# Patient Record
Sex: Female | Born: 1985 | Race: White | Hispanic: No | Marital: Single | Smoking: Current every day smoker
Health system: Southern US, Community
[De-identification: ages and names within clinical notes are randomized; demographics above are authoritative.]

## PROBLEM LIST (undated history)

## (undated) ENCOUNTER — Inpatient Hospital Stay (HOSPITAL_COMMUNITY): Payer: Self-pay

## (undated) DIAGNOSIS — K5900 Constipation, unspecified: Secondary | ICD-10-CM

## (undated) DIAGNOSIS — N200 Calculus of kidney: Secondary | ICD-10-CM

## (undated) DIAGNOSIS — B977 Papillomavirus as the cause of diseases classified elsewhere: Secondary | ICD-10-CM

## (undated) DIAGNOSIS — IMO0002 Reserved for concepts with insufficient information to code with codable children: Secondary | ICD-10-CM

## (undated) DIAGNOSIS — R87619 Unspecified abnormal cytological findings in specimens from cervix uteri: Secondary | ICD-10-CM

## (undated) HISTORY — PX: DILATION AND CURETTAGE OF UTERUS: SHX78

---
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
## 2007-08-11 ENCOUNTER — Ambulatory Visit: Payer: Self-pay | Admitting: Internal Medicine

## 2007-08-11 DIAGNOSIS — L259 Unspecified contact dermatitis, unspecified cause: Secondary | ICD-10-CM

## 2007-08-11 DIAGNOSIS — F17200 Nicotine dependence, unspecified, uncomplicated: Secondary | ICD-10-CM | POA: Insufficient documentation

## 2008-01-26 ENCOUNTER — Ambulatory Visit: Payer: Self-pay | Admitting: Family Medicine

## 2008-01-26 DIAGNOSIS — K5289 Other specified noninfective gastroenteritis and colitis: Secondary | ICD-10-CM | POA: Insufficient documentation

## 2008-01-26 DIAGNOSIS — R112 Nausea with vomiting, unspecified: Secondary | ICD-10-CM

## 2008-01-26 LAB — CONVERTED CEMR LAB: Beta hcg, urine, semiquantitative: NEGATIVE

## 2010-08-28 ENCOUNTER — Emergency Department (HOSPITAL_COMMUNITY): Admission: EM | Admit: 2010-08-28 | Discharge: 2010-08-28 | Payer: Self-pay | Admitting: Emergency Medicine

## 2010-09-02 ENCOUNTER — Emergency Department (HOSPITAL_COMMUNITY): Admission: EM | Admit: 2010-09-02 | Discharge: 2010-09-02 | Payer: Self-pay | Admitting: Family Medicine

## 2010-09-20 ENCOUNTER — Emergency Department (HOSPITAL_COMMUNITY): Admission: EM | Admit: 2010-09-20 | Discharge: 2010-09-20 | Payer: Self-pay | Admitting: Family Medicine

## 2010-10-11 ENCOUNTER — Ambulatory Visit (HOSPITAL_COMMUNITY)
Admission: RE | Admit: 2010-10-11 | Discharge: 2010-10-11 | Payer: Self-pay | Source: Home / Self Care | Admitting: Urology

## 2011-02-21 LAB — POCT PREGNANCY, URINE
Preg Test, Ur: NEGATIVE
Preg Test, Ur: NEGATIVE

## 2011-02-21 LAB — POCT URINALYSIS DIPSTICK
Bilirubin Urine: NEGATIVE
Bilirubin Urine: NEGATIVE
Glucose, UA: NEGATIVE mg/dL
Ketones, ur: NEGATIVE mg/dL
Nitrite: NEGATIVE
Protein, ur: NEGATIVE mg/dL
Specific Gravity, Urine: <=1.005 (ref 1.005–1.030)
Urobilinogen, UA: 0.2 mg/dL (ref 0.0–1.0)
pH: 5.5 (ref 5.0–8.0)

## 2011-02-21 LAB — URINALYSIS, ROUTINE W REFLEX MICROSCOPIC
Ketones, ur: NEGATIVE mg/dL
Leukocytes, UA: NEGATIVE
Protein, ur: NEGATIVE mg/dL
Urobilinogen, UA: 0.2 mg/dL (ref 0.0–1.0)

## 2011-02-21 LAB — URINE MICROSCOPIC-ADD ON

## 2013-12-09 NOTE — L&D Delivery Note (Signed)
Attestation of Attending Supervision of Advanced Practitioner (CNM/NP): Evaluation and management procedures were performed by the Advanced Practitioner under my supervision and collaboration.  I have reviewed the Advanced Practitioner's note and chart, and I agree with the management and plan.  HARRAWAY-SMITH, Luan Urbani 10:27 AM     

## 2013-12-09 NOTE — L&D Delivery Note (Signed)
Delivery Note  At 2253, after about 1/5 hours of pushing,  a viable female was delivered via  (Presentation:LOA ).  The shoulders were not forthcoming, so the posterior (left) axilla was grasped with my index finger, and the baby was rotated clockwise into the oblique diameter.  At this point, the (now) anterior shoulder was released, and the baby delivered.  At no time was any traction placed on the baby's head.  APGAR: 9/9 ; weight pending.  40 units of pitocin diluted in 1000cc LR was infused rapidly IV.  The placenta separated spontaneously and delivered via CCT and maternal pushing effort.  It was inspected and appears to be intact with a 3 VC.  There were the following complications:   Anesthesia: Epidural  Episiotomy:  Lacerations:  Suture Repair: 3.0 vicryl Est. Blood Loss (mL): 200  Mom to postpartum.  Baby to Couplet care / Skin to Skin.  Delivery and repair by Dr. Andria MeuseStevens under my supervision

## 2013-12-16 ENCOUNTER — Inpatient Hospital Stay (HOSPITAL_COMMUNITY): Payer: Medicaid Other

## 2013-12-16 ENCOUNTER — Inpatient Hospital Stay (HOSPITAL_COMMUNITY)
Admission: AD | Admit: 2013-12-16 | Discharge: 2013-12-16 | Disposition: A | Discharge status: Home / Self Care | Payer: Medicaid Other | Source: Ambulatory Visit | Attending: Obstetrics & Gynecology | Admitting: Obstetrics & Gynecology

## 2013-12-16 ENCOUNTER — Encounter (HOSPITAL_COMMUNITY): Payer: Self-pay | Admitting: *Deleted

## 2013-12-16 DIAGNOSIS — O210 Mild hyperemesis gravidarum: Secondary | ICD-10-CM | POA: Insufficient documentation

## 2013-12-16 DIAGNOSIS — O99330 Smoking (tobacco) complicating pregnancy, unspecified trimester: Secondary | ICD-10-CM | POA: Insufficient documentation

## 2013-12-16 DIAGNOSIS — O98819 Other maternal infectious and parasitic diseases complicating pregnancy, unspecified trimester: Secondary | ICD-10-CM | POA: Insufficient documentation

## 2013-12-16 DIAGNOSIS — R109 Unspecified abdominal pain: Secondary | ICD-10-CM | POA: Insufficient documentation

## 2013-12-16 DIAGNOSIS — A5901 Trichomonal vulvovaginitis: Secondary | ICD-10-CM | POA: Insufficient documentation

## 2013-12-16 DIAGNOSIS — O219 Vomiting of pregnancy, unspecified: Secondary | ICD-10-CM

## 2013-12-16 DIAGNOSIS — N898 Other specified noninflammatory disorders of vagina: Secondary | ICD-10-CM | POA: Insufficient documentation

## 2013-12-16 DIAGNOSIS — O26899 Other specified pregnancy related conditions, unspecified trimester: Secondary | ICD-10-CM

## 2013-12-16 HISTORY — DX: Calculus of kidney: N20.0

## 2013-12-16 HISTORY — DX: Papillomavirus as the cause of diseases classified elsewhere: B97.7

## 2013-12-16 HISTORY — DX: Reserved for concepts with insufficient information to code with codable children: IMO0002

## 2013-12-16 HISTORY — DX: Unspecified abnormal cytological findings in specimens from cervix uteri: R87.619

## 2013-12-16 LAB — URINALYSIS, ROUTINE W REFLEX MICROSCOPIC
Bilirubin Urine: NEGATIVE
GLUCOSE, UA: NEGATIVE mg/dL
Hgb urine dipstick: NEGATIVE
KETONES UR: NEGATIVE mg/dL
LEUKOCYTES UA: NEGATIVE
Nitrite: NEGATIVE
PROTEIN: NEGATIVE mg/dL
Specific Gravity, Urine: 1.01 (ref 1.005–1.030)
Urobilinogen, UA: 0.2 mg/dL (ref 0.0–1.0)
pH: 6.5 (ref 5.0–8.0)

## 2013-12-16 LAB — CBC
HEMATOCRIT: 38 % (ref 36.0–46.0)
HEMOGLOBIN: 12.9 g/dL (ref 12.0–15.0)
MCH: 31.5 pg (ref 26.0–34.0)
MCHC: 33.9 g/dL (ref 30.0–36.0)
MCV: 92.9 fL (ref 78.0–100.0)
Platelets: 414 10*3/uL — ABNORMAL HIGH (ref 150–400)
RBC: 4.09 MIL/uL (ref 3.87–5.11)
RDW: 12.9 % (ref 11.5–15.5)
WBC: 12.7 10*3/uL — ABNORMAL HIGH (ref 4.0–10.5)

## 2013-12-16 LAB — HCG, QUANTITATIVE, PREGNANCY: hCG, Beta Chain, Quant, S: 50791 m[IU]/mL — ABNORMAL HIGH (ref <5)

## 2013-12-16 LAB — WET PREP, GENITAL
Clue Cells Wet Prep HPF POC: NONE SEEN
YEAST WET PREP: NONE SEEN

## 2013-12-16 LAB — POCT PREGNANCY, URINE: PREG TEST UR: POSITIVE — AB

## 2013-12-16 MED ORDER — METRONIDAZOLE 500 MG PO TABS
ORAL_TABLET | ORAL | Status: AC
  Filled 2013-12-16: qty 2

## 2013-12-16 MED ORDER — ONDANSETRON 8 MG PO TBDP: 8.0000 mg | ORAL_TABLET | Freq: Three times a day (TID) | ORAL | Status: DC | PRN

## 2013-12-16 MED ORDER — ONDANSETRON 8 MG PO TBDP
ORAL_TABLET | ORAL | Status: DC
Start: 2013-12-16 — End: 2013-12-16
  Filled 2013-12-16: qty 1

## 2013-12-16 MED ORDER — METRONIDAZOLE 500 MG PO TABS
1000.0000 mg | ORAL_TABLET | Freq: Once | ORAL | Status: AC
  Administered 2013-12-16: 500 mg via ORAL

## 2013-12-16 MED ORDER — ONDANSETRON 8 MG PO TBDP
8.0000 mg | ORAL_TABLET | Freq: Once | ORAL | Status: AC
  Administered 2013-12-16: 8 mg via ORAL

## 2013-12-16 MED ORDER — PROMETHAZINE HCL 25 MG PO TABS: 25.0000 mg | ORAL_TABLET | Freq: Four times a day (QID) | ORAL | Status: DC | PRN

## 2013-12-16 NOTE — MAU Note (Signed)
Pt reports she has had abd cramping on and off for the past 2-3 days.denies bleeding but reports a clear vaginal discharge.

## 2013-12-16 NOTE — MAU Provider Note (Signed)
History     CSN: [id number]  Arrival date and time: 12/16/13 1431   First Provider Initiated Contact with Patient 12/16/13 [phone]      Chief Complaint  Patient presents with  . Abdominal Cramping   HPI pt is G2P0010 @[redacted]w[redacted]d .  Pt presents with abdominal cramping on and off for the past 2 to 3 days.  The pain is worse during the day. Pt denies UTI sx, spotting or bleeding.  Pt has hx of kidney stones 2 years ago.  Pt denies vaginal bleeding; has small amount of Clear vaginal discharge.  Pt has been nauseated but has not been vomiting.   Pt had a positive home pregnancy test and positive pregnancy test at [facility].   RN note: Nurse Signed MAU Note Service date: 12/16/2013 3:13 PM   Pt reports she has had abd cramping on and off for the past 2-3 days.denies bleeding but reports a clear vaginal discharge.    Past Medical History  Diagnosis Date  . Kidney stones   . Abnormal Pap smear   . HPV (human papilloma virus) infection     Past Surgical History  Procedure Laterality Date  . Dilation and curettage of uterus      Family History  Problem Relation Age of Onset  . Diabetes Mother   . Heart disease Paternal Grandfather     History  Substance Use Topics  . Smoking status: Current Every Day Smoker -- 0.25 packs/day  . Smokeless tobacco: Not on file  . Alcohol Use: No    Allergies: No Known Allergies  Prescriptions prior to admission  Medication Sig Dispense Refill  . Prenatal Vit-Fe Fumarate-FA (PRENATAL MULTIVITAMIN) TABS tablet Take 1 tablet by mouth at bedtime.        Review of Systems  Constitutional: Negative for fever and chills.  Gastrointestinal: Positive for nausea, abdominal pain and constipation. Negative for heartburn, vomiting and diarrhea.  Genitourinary: Negative for dysuria, urgency and frequency.  Musculoskeletal: Positive for back pain.  Neurological: Negative for dizziness and headaches.   Physical Exam   Blood pressure  117/67, pulse 95, temperature 98.3 F (36.8 C), temperature source Oral, resp. rate 18, height 5\' 2"  (1.575 m), weight 73.755 kg (162 lb 9.6 oz), last menstrual period 10/10/2013.  Physical Exam  Nursing note and vitals reviewed. Constitutional: She is oriented to person, place, and time. She appears well-developed and well-nourished. No distress.  HENT:  Head: Normocephalic.  Eyes: Pupils are equal, round, and reactive to light.  Neck: Normal range of motion. Neck supple.  Cardiovascular: Normal rate.   Respiratory: Effort normal.  GI: Soft. She exhibits no distension. There is tenderness. There is no rebound and no guarding.  Genitourinary: Vagina normal and uterus normal. No vaginal discharge found.  Musculoskeletal: Normal range of motion.  Neurological: She is alert and oriented to person, place, and time.  Skin: Skin is warm and dry.  Psychiatric: She has a normal mood and affect.    MAU Course  Procedures Results for orders placed during the hospital encounter of 12/16/13 (from the past 24 hour(s))  URINALYSIS, ROUTINE W REFLEX MICROSCOPIC     Status: None   Collection Time    12/16/13  3:15 PM      Result Value Range   Color, Urine YELLOW  YELLOW   APPearance CLEAR  CLEAR   Specific Gravity, Urine 1.010  1.005 - 1.030   pH 6.5  5.0 - 8.0   Glucose, UA NEGATIVE  NEGATIVE  mg/dL   Hgb urine dipstick NEGATIVE  NEGATIVE   Bilirubin Urine NEGATIVE  NEGATIVE   Ketones, ur NEGATIVE  NEGATIVE mg/dL   Protein, ur NEGATIVE  NEGATIVE mg/dL   Urobilinogen, UA 0.2  0.0 - 1.0 mg/dL   Nitrite NEGATIVE  NEGATIVE   Leukocytes, UA NEGATIVE  NEGATIVE  POCT PREGNANCY, URINE     Status: Abnormal   Collection Time    12/16/13  4:16 PM      Result Value Range   Preg Test, Ur POSITIVE (*) NEGATIVE  CBC     Status: Abnormal   Collection Time    12/16/13  4:18 PM      Result Value Range   WBC 12.7 (*) 4.0 - 10.5 K/uL   RBC 4.09  3.87 - 5.11 MIL/uL   Hemoglobin 12.9  12.0 - 15.0 g/dL    HCT 69.638.0  29.536.0 - 28.446.0 %   MCV 92.9  78.0 - 100.0 fL   MCH 31.5  26.0 - 34.0 pg   MCHC 33.9  30.0 - 36.0 g/dL   RDW 13.212.9  44.011.5 - 10.215.5 %   Platelets 414 (*) 150 - 400 K/uL  [place] Ob Comp Less 14 Wks  12/16/2013   CLINICAL DATA:  Pregnant, cramping  EXAM: OBSTETRIC <14 WK ULTRASOUND  TECHNIQUE: Transabdominal ultrasound was performed for evaluation of the gestation as well as the maternal uterus and adnexal regions.  COMPARISON:  None.  FINDINGS: Intrauterine gestational sac: Visualized/normal in shape.  Yolk sac:  Identified  Embryo:  Identified  Cardiac Activity: Identified  Heart Rate: 179 bpm  CRL:   23.7  mm   9 w 1 d                  US EDC: 07/20/2014  Maternal uterus/adnexae: No evidence of subchorionic hemorrhage. Left ovary not visualized. There is a simple cyst on the right ovary measuring last 3 cm. This could represent corpus luteum.  IMPRESSION: Live intrauterine gestation   Electronically Signed   By: Esperanza Heiraymond  Rubner M.D.   On: 12/16/2013 17:09   Results for orders placed during the hospital encounter of 12/16/13 (from the past 24 hour(s))  URINALYSIS, ROUTINE W REFLEX MICROSCOPIC     Status: None   Collection Time    12/16/13  3:15 PM      Result Value Range   Color, Urine YELLOW  YELLOW   APPearance CLEAR  CLEAR   Specific Gravity, Urine 1.010  1.005 - 1.030   pH 6.5  5.0 - 8.0   Glucose, UA NEGATIVE  NEGATIVE mg/dL   Hgb urine dipstick NEGATIVE  NEGATIVE   Bilirubin Urine NEGATIVE  NEGATIVE   Ketones, ur NEGATIVE  NEGATIVE mg/dL   Protein, ur NEGATIVE  NEGATIVE mg/dL   Urobilinogen, UA 0.2  0.0 - 1.0 mg/dL   Nitrite NEGATIVE  NEGATIVE   Leukocytes, UA NEGATIVE  NEGATIVE  POCT PREGNANCY, URINE     Status: Abnormal   Collection Time    12/16/13  4:16 PM      Result Value Range   Preg Test, Ur POSITIVE (*) NEGATIVE  HCG, QUANTITATIVE, PREGNANCY     Status: Abnormal   Collection Time    12/16/13  4:18 PM      Result Value Range   hCG, Beta Chain, [place] LongestQuant, S [id number] (*) <5 mIU/mL   CBC     Status: Abnormal   Collection Time    12/16/13  4:18 PM  Result Value Range   WBC 12.7 (*) 4.0 - 10.5 K/uL   RBC 4.09  3.87 - 5.11 MIL/uL   Hemoglobin 12.9  12.0 - 15.0 g/dL   HCT 16.1  09.6 - 04.5 %   MCV 92.9  78.0 - 100.0 fL   MCH 31.5  26.0 - 34.0 pg   MCHC 33.9  30.0 - 36.0 g/dL   RDW 40.9  81.1 - 91.4 %   Platelets 414 (*) 150 - 400 K/uL  WET PREP, GENITAL     Status: Abnormal   Collection Time    12/16/13  4:25 PM      Result Value Range   Yeast Wet Prep HPF POC NONE SEEN  NONE SEEN   Trich, Wet Prep FEW (*) NONE SEEN   Clue Cells Wet Prep HPF POC NONE SEEN  NONE SEEN   WBC, Wet Prep HPF POC FEW (*) NONE SEEN  Flagyl 1 gm give in MAU- partner to go to health dept to be treated - no sex until 1 week after both treated US Ob Comp Less 14 Wks  12/16/2013   CLINICAL DATA:  Pregnant, cramping  EXAM: OBSTETRIC <14 WK ULTRASOUND  TECHNIQUE: Transabdominal ultrasound was performed for evaluation of the gestation as well as the maternal uterus and adnexal regions.  COMPARISON:  None.  FINDINGS: Intrauterine gestational sac: Visualized/normal in shape.  Yolk sac:  Identified  Embryo:  Identified  Cardiac Activity: Identified  Heart Rate: 179 bpm  CRL:   23.7  mm   9 w 1 d                  [place] EDC: 07/20/2014  Maternal uterus/adnexae: No evidence of subchorionic hemorrhage. Left ovary not visualized. There is a simple cyst on the right ovary measuring last 3 cm. This could represent corpus luteum.  IMPRESSION: Live intrauterine gestation   Electronically Signed   By: Esperanza Heir M.D.   On: 12/16/2013 17:09   GC/Chlamydia pending Assessment and Plan  Abdominal pain in pregnancy Viable [redacted]w[redacted]d IUP Morning sickness- Zofran and phenergan Rx diet for morning sickness Trichomonas vaginitis- Rx in MAU F/u for OB care- note sent to clinic for pt to be seen at [facility]  [facility] 12/16/2013, 4:03 PM

## 2013-12-16 NOTE — MAU Provider Note (Signed)
Attestation of Attending Supervision of Advanced Practitioner (CNM/NP): Evaluation and management procedures were performed by the Advanced Practitioner under my supervision and collaboration.  I have reviewed the Advanced Practitioner's note and chart, and I agree with the management and plan.  HARRAWAY-SMITH, Mayford Alberg 10:44 PM     

## 2013-12-17 LAB — GC/CHLAMYDIA PROBE AMP
CT PROBE, AMP APTIMA: NEGATIVE
GC PROBE AMP APTIMA: NEGATIVE

## 2014-01-25 ENCOUNTER — Encounter: Payer: Self-pay | Admitting: Family

## 2014-01-25 ENCOUNTER — Ambulatory Visit (INDEPENDENT_AMBULATORY_CARE_PROVIDER_SITE_OTHER): Payer: Medicaid Other | Admitting: Family

## 2014-01-25 ENCOUNTER — Other Ambulatory Visit (HOSPITAL_COMMUNITY)
Admission: RE | Admit: 2014-01-25 | Discharge: 2014-01-25 | Disposition: A | Discharge status: Home / Self Care | Payer: Medicaid Other | Source: Ambulatory Visit | Attending: Family Medicine | Admitting: Family Medicine

## 2014-01-25 VITALS — BP 113/77 | Temp 97.5°F | Wt 165.4 lb

## 2014-01-25 DIAGNOSIS — Z3480 Encounter for supervision of other normal pregnancy, unspecified trimester: Secondary | ICD-10-CM

## 2014-01-25 DIAGNOSIS — Z01419 Encounter for gynecological examination (general) (routine) without abnormal findings: Secondary | ICD-10-CM | POA: Insufficient documentation

## 2014-01-25 DIAGNOSIS — Z3490 Encounter for supervision of normal pregnancy, unspecified, unspecified trimester: Secondary | ICD-10-CM

## 2014-01-25 DIAGNOSIS — Z23 Encounter for immunization: Secondary | ICD-10-CM

## 2014-01-25 DIAGNOSIS — O0990 Supervision of high risk pregnancy, unspecified, unspecified trimester: Secondary | ICD-10-CM | POA: Insufficient documentation

## 2014-01-25 DIAGNOSIS — A5901 Trichomonal vulvovaginitis: Secondary | ICD-10-CM | POA: Insufficient documentation

## 2014-01-25 DIAGNOSIS — O2390 Unspecified genitourinary tract infection in pregnancy, unspecified trimester: Secondary | ICD-10-CM

## 2014-01-25 DIAGNOSIS — Z124 Encounter for screening for malignant neoplasm of cervix: Secondary | ICD-10-CM

## 2014-01-25 DIAGNOSIS — O0930 Supervision of pregnancy with insufficient antenatal care, unspecified trimester: Secondary | ICD-10-CM

## 2014-01-25 DIAGNOSIS — O23591 Infection of other part of genital tract in pregnancy, first trimester: Secondary | ICD-10-CM

## 2014-01-25 LAB — POCT URINALYSIS DIP (DEVICE)
BILIRUBIN URINE: NEGATIVE
GLUCOSE, UA: NEGATIVE mg/dL
Hgb urine dipstick: NEGATIVE
Ketones, ur: NEGATIVE mg/dL
LEUKOCYTES UA: NEGATIVE
NITRITE: NEGATIVE
PH: 7 (ref 5.0–8.0)
Protein, ur: NEGATIVE mg/dL
Specific Gravity, Urine: 1.015 (ref 1.005–1.030)
Urobilinogen, UA: 0.2 mg/dL (ref 0.0–1.0)

## 2014-01-25 LAB — HIV ANTIBODY (ROUTINE TESTING W REFLEX): HIV: NONREACTIVE

## 2014-01-25 NOTE — Progress Notes (Signed)
   Subjective:    Taylor Ford is a G2P0010 1019w6d being seen today for her first obstetrical visit.  Her obstetrical history is significant for trichomoniasis in 1st trimester.. Patient does intend to breast feed. Pregnancy history fully reviewed.  Patient reports no bleeding, no contractions and no cramping.  Filed Vitals:   01/25/14 1312  BP: 113/77  Temp: 97.5 F (36.4 C)  Weight: 165 lb 6.4 oz (75.025 kg)    HISTORY: OB History  Gravida Para Term Preterm AB SAB TAB Ectopic Multiple Living  2 0 0 0 1 1 0 0 0 0     # Outcome Date GA Lbr Len/2nd Weight Sex Delivery Anes PTL Lv  2 CUR           1 SAB 2007             Past Medical History  Diagnosis Date  . Abnormal Pap smear     abnl 28 yo, all paps normal since  . HPV (human papilloma virus) infection   . Kidney stones     2012   Past Surgical History  Procedure Laterality Date  . Dilation and curettage of uterus     Family History  Problem Relation Age of Onset  . Diabetes Mother   . Heart disease Paternal Grandfather   . Cancer Maternal Grandfather   . Hypertension Paternal Grandmother      Exam     Exam   BP 113/77  Temp(Src) 97.5 F (36.4 C)  Wt 165 lb 6.4 oz (75.025 kg)  LMP 10/10/2013 Uterine Size: Fundal height 14cm  Pelvic Exam:    Perineum: No Hemorrhoids, Normal Perineum   Vulva: normal   Vagina:  normal mucosa, normal discharge, no palpable nodules   pH: Not done   Cervix: no bleeding following Pap, no cervical motion tenderness and no lesions   Adnexa: normal adnexa and no mass, fullness, tenderness   Bony Pelvis: Adequate  System: Breast:  No nipple retraction or dimpling, No nipple discharge or bleeding, No axillary or supraclavicular adenopathy, Normal to palpation without dominant masses   Skin: normal coloration and turgor, no rashes    Neurologic: negative   Extremities: normal strength, tone, and muscle mass   HEENT neck supple with midline trachea and thyroid without  masses   Mouth/Teeth mucous membranes moist, pharynx normal without lesions   Neck supple and no masses   Cardiovascular: regular rate and rhythm, no murmurs or gallops   Respiratory:  appears well, vitals normal, no respiratory distress, acyanotic, normal RR, neck free of mass or lymphadenopathy, chest clear, no wheezing, crepitations, rhonchi, normal symmetric air entry   Abdomen: soft, non-tender; bowel sounds normal; no masses,  no organomegaly   Urinary: urethral meatus normal       Assessment:    Pregnancy: 28 yo G2P0010 at 14 wks IUP Trichomoniasis in 1st Trimester  Patient Active Problem List   Diagnosis Date Noted  . Supervision of normal pregnancy 01/25/2014  . Trichomonal vaginitis in pregnancy in first trimester 01/25/2014  . GASTROENTERITIS, ACUTE 01/26/2008  . NAUSEA WITH VOMITING 01/26/2008  . TOBACCO ABUSE 08/11/2007  . DERMATITIS, CONTACT, NOS 08/11/2007        Plan:     Initial labs drawn. Pap collected. Prenatal vitamins. Problem list reviewed and updated. Genetic Screening discussed Quad Screen: requested. Order at next visit. Ultrasound discussed; fetal survey: ordered. Follow up in 4 weeks.  [facility]UHAMMAD,WALIDAH 01/25/2014

## 2014-01-25 NOTE — Patient Instructions (Signed)
Second Trimester of Pregnancy The second trimester is from week 13 through week 28, months 4 through 6. The second trimester is often a time when you feel your best. Your body has also adjusted to being pregnant, and you begin to feel better physically. Usually, morning sickness has lessened or quit completely, you may have more energy, and you may have an increase in appetite. The second trimester is also a time when the fetus is growing rapidly. At the end of the sixth month, the fetus is about 9 inches long and weighs about 1 pounds. You will likely begin to feel the baby move (quickening) between 18 and 20 weeks of the pregnancy. BODY CHANGES Your body goes through many changes during pregnancy. The changes vary from woman to woman.   Your weight will continue to increase. You will notice your lower abdomen bulging out.  You may begin to get stretch marks on your hips, abdomen, and breasts.  You may develop headaches that can be relieved by medicines approved by your caregiver.  You may urinate more often because the fetus is pressing on your bladder.  You may develop or continue to have heartburn as a result of your pregnancy.  You may develop constipation because certain hormones are causing the muscles that push waste through your intestines to slow down.  You may develop hemorrhoids or swollen, bulging veins (varicose veins).  You may have back pain because of the weight gain and pregnancy hormones relaxing your joints between the bones in your pelvis and as a result of a shift in weight and the muscles that support your balance.  Your breasts will continue to grow and be tender.  Your gums may bleed and may be sensitive to brushing and flossing.  Dark spots or blotches (chloasma, mask of pregnancy) may develop on your face. This will likely fade after the baby is born.  A dark line from your belly button to the pubic area (linea nigra) may appear. This will likely fade after the  baby is born. WHAT TO EXPECT AT YOUR PRENATAL VISITS During a routine prenatal visit:  You will be weighed to make sure you and the fetus are growing normally.  Your blood pressure will be taken.  Your abdomen will be measured to track your baby's growth.  The fetal heartbeat will be listened to.  Any test results from the previous visit will be discussed. Your caregiver may ask you:  How you are feeling.  If you are feeling the baby move.  If you have had any abnormal symptoms, such as leaking fluid, bleeding, severe headaches, or abdominal cramping.  If you have any questions. Other tests that may be performed during your second trimester include:  Blood tests that check for:  Low iron levels (anemia).  Gestational diabetes (between 24 and 28 weeks).  Rh antibodies.  Urine tests to check for infections, diabetes, or protein in the urine.  An ultrasound to confirm the proper growth and development of the baby.  An amniocentesis to check for possible genetic problems.  Fetal screens for spina bifida and Down syndrome. HOME CARE INSTRUCTIONS   Avoid all smoking, herbs, alcohol, and unprescribed drugs. These chemicals affect the formation and growth of the baby.  Follow your caregiver's instructions regarding medicine use. There are medicines that are either safe or unsafe to take during pregnancy.  Exercise only as directed by your caregiver. Experiencing uterine cramps is a good sign to stop exercising.  Continue to eat regular,   healthy meals.  Wear a good support bra for breast tenderness.  Do not use hot tubs, steam rooms, or saunas.  Wear your seat belt at all times when driving.  Avoid raw meat, uncooked cheese, cat litter boxes, and soil used by cats. These carry germs that can cause birth defects in the baby.  Take your prenatal vitamins.  Try taking a stool softener (if your caregiver approves) if you develop constipation. Eat more high-fiber foods,  such as fresh vegetables or fruit and whole grains. Drink plenty of fluids to keep your urine clear or pale yellow.  Take warm sitz baths to soothe any pain or discomfort caused by hemorrhoids. Use hemorrhoid cream if your caregiver approves.  If you develop varicose veins, wear support hose. Elevate your feet for 15 minutes, 3 4 times a day. Limit salt in your diet.  Avoid heavy lifting, wear low heel shoes, and practice good posture.  Rest with your legs elevated if you have leg cramps or low back pain.  Visit your dentist if you have not gone yet during your pregnancy. Use a soft toothbrush to brush your teeth and be gentle when you floss.  A sexual relationship may be continued unless your caregiver directs you otherwise.  Continue to go to all your prenatal visits as directed by your caregiver. SEEK MEDICAL CARE IF:   You have dizziness.  You have mild pelvic cramps, pelvic pressure, or nagging pain in the abdominal area.  You have persistent nausea, vomiting, or diarrhea.  You have a bad smelling vaginal discharge.  You have pain with urination. SEEK IMMEDIATE MEDICAL CARE IF:   You have a fever.  You are leaking fluid from your vagina.  You have spotting or bleeding from your vagina.  You have severe abdominal cramping or pain.  You have rapid weight gain or loss.  You have shortness of breath with chest pain.  You notice sudden or extreme swelling of your face, hands, ankles, feet, or legs.  You have not felt your baby move in over an hour.  You have severe headaches that do not go away with medicine.  You have vision changes. Document Released: 11/19/2001 Document Revised: 07/28/2013 Document Reviewed: 01/26/2013 [employer] Patient Information 2014 [employer].  

## 2014-01-25 NOTE — Assessment & Plan Note (Signed)
Flu vaccine 01/25/2014

## 2014-01-25 NOTE — Progress Notes (Signed)
Pulse- 102 Patient reports pain/numbness in right hip

## 2014-01-26 ENCOUNTER — Encounter: Payer: Self-pay | Admitting: Family

## 2014-01-26 LAB — PRESCRIPTION MONITORING PROFILE (19 PANEL)
Amphetamine/Meth: NEGATIVE ng/mL
Barbiturate Screen, Urine: NEGATIVE ng/mL
Benzodiazepine Screen, Urine: NEGATIVE ng/mL
Buprenorphine, Urine: NEGATIVE ng/mL
CARISOPRODOL, URINE: NEGATIVE ng/mL
Cannabinoid Scrn, Ur: NEGATIVE ng/mL
Cocaine Metabolites: NEGATIVE ng/mL
Creatinine, Urine: 35.66 mg/dL (ref >20.0)
Fentanyl, Ur: NEGATIVE ng/mL
MDMA URINE: NEGATIVE ng/mL
MEPERIDINE UR: NEGATIVE ng/mL
Methadone Screen, Urine: NEGATIVE ng/mL
Methaqualone: NEGATIVE ng/mL
NITRITES URINE, INITIAL: NEGATIVE ug/mL
OXYCODONE SCRN UR: NEGATIVE ng/mL
Opiate Screen, Urine: NEGATIVE ng/mL
PH URINE, INITIAL: 7.9 pH (ref 4.5–8.9)
Phencyclidine, Ur: NEGATIVE ng/mL
Propoxyphene: NEGATIVE ng/mL
TRAMADOL UR: NEGATIVE ng/mL
Tapentadol, urine: NEGATIVE ng/mL
Zolpidem, Urine: NEGATIVE ng/mL

## 2014-01-26 LAB — OBSTETRIC PANEL
Antibody Screen: NEGATIVE
Basophils Absolute: 0 10*3/uL (ref 0.0–0.1)
Basophils Relative: 0 % (ref 0–1)
Eosinophils Absolute: 0.1 10*3/uL (ref 0.0–0.7)
Eosinophils Relative: 1 % (ref 0–5)
HCT: 35.9 % — ABNORMAL LOW (ref 36.0–46.0)
HEP B S AG: NEGATIVE
Hemoglobin: 12.1 g/dL (ref 12.0–15.0)
LYMPHS ABS: 1.8 10*3/uL (ref 0.7–4.0)
LYMPHS PCT: 17 % (ref 12–46)
MCH: 31.5 pg (ref 26.0–34.0)
MCHC: 33.7 g/dL (ref 30.0–36.0)
MCV: 93.5 fL (ref 78.0–100.0)
Monocytes Absolute: 0.4 10*3/uL (ref 0.1–1.0)
Monocytes Relative: 4 % (ref 3–12)
NEUTROS ABS: 8.5 10*3/uL — AB (ref 1.7–7.7)
NEUTROS PCT: 78 % — AB (ref 43–77)
PLATELETS: 416 10*3/uL — AB (ref 150–400)
RBC: 3.84 MIL/uL — AB (ref 3.87–5.11)
RDW: 13.3 % (ref 11.5–15.5)
RUBELLA: 0.7 {index} (ref <0.90)
Rh Type: POSITIVE
WBC: 10.7 10*3/uL — AB (ref 4.0–10.5)

## 2014-01-26 LAB — GLUCOSE TOLERANCE, 1 HOUR (50G) W/O FASTING: GLUCOSE 1 HOUR GTT: 133 mg/dL (ref 70–140)

## 2014-01-27 ENCOUNTER — Encounter: Payer: Self-pay | Admitting: Family

## 2014-01-27 LAB — CULTURE, OB URINE
Colony Count: NO GROWTH
Organism ID, Bacteria: NO GROWTH

## 2014-01-27 LAB — CYSTIC FIBROSIS DIAGNOSTIC STUDY

## 2014-02-23 ENCOUNTER — Ambulatory Visit (INDEPENDENT_AMBULATORY_CARE_PROVIDER_SITE_OTHER): Payer: Medicaid Other | Admitting: Family Medicine

## 2014-02-23 ENCOUNTER — Encounter: Payer: Self-pay | Admitting: Family Medicine

## 2014-02-23 VITALS — BP 116/78 | Wt 169.9 lb

## 2014-02-23 DIAGNOSIS — Z3490 Encounter for supervision of normal pregnancy, unspecified, unspecified trimester: Secondary | ICD-10-CM

## 2014-02-23 DIAGNOSIS — Z3480 Encounter for supervision of other normal pregnancy, unspecified trimester: Secondary | ICD-10-CM

## 2014-02-23 LAB — POCT URINALYSIS DIP (DEVICE)
Bilirubin Urine: NEGATIVE
Glucose, UA: NEGATIVE mg/dL
HGB URINE DIPSTICK: NEGATIVE
Ketones, ur: NEGATIVE mg/dL
Leukocytes, UA: NEGATIVE
NITRITE: NEGATIVE
Protein, ur: NEGATIVE mg/dL
Specific Gravity, Urine: 1.02 (ref 1.005–1.030)
UROBILINOGEN UA: 0.2 mg/dL (ref 0.0–1.0)
pH: 7 (ref 5.0–8.0)

## 2014-02-23 NOTE — Progress Notes (Signed)
Pulse- 101 

## 2014-02-23 NOTE — Progress Notes (Signed)
S: 28 yo G2P0010 @ 2990w0d here for ROBV - still smoking. Wanting to kow what to take to quit - quad screen today - no ctx, lof, vb  O: see flowsheet  A/P - doing well - discussed quitting smoking and urged both her and husband to quit. Ok to use patches if goal is to come off completely but not if goal is to supplement for cigarrettes - quad today - US tomorrow - f/u in 4 weeks.

## 2014-02-23 NOTE — Patient Instructions (Signed)
Second Trimester of Pregnancy The second trimester is from week 13 through week 28, months 4 through 6. The second trimester is often a time when you feel your best. Your body has also adjusted to being pregnant, and you begin to feel better physically. Usually, morning sickness has lessened or quit completely, you may have more energy, and you may have an increase in appetite. The second trimester is also a time when the fetus is growing rapidly. At the end of the sixth month, the fetus is about 9 inches long and weighs about 1 pounds. You will likely begin to feel the baby move (quickening) between 18 and 20 weeks of the pregnancy. BODY CHANGES Your body goes through many changes during pregnancy. The changes vary from woman to woman.   Your weight will continue to increase. You will notice your lower abdomen bulging out.  You may begin to get stretch marks on your hips, abdomen, and breasts.  You may develop headaches that can be relieved by medicines approved by your caregiver.  You may urinate more often because the fetus is pressing on your bladder.  You may develop or continue to have heartburn as a result of your pregnancy.  You may develop constipation because certain hormones are causing the muscles that push waste through your intestines to slow down.  You may develop hemorrhoids or swollen, bulging veins (varicose veins).  You may have back pain because of the weight gain and pregnancy hormones relaxing your joints between the bones in your pelvis and as a result of a shift in weight and the muscles that support your balance.  Your breasts will continue to grow and be tender.  Your gums may bleed and may be sensitive to brushing and flossing.  Dark spots or blotches (chloasma, mask of pregnancy) may develop on your face. This will likely fade after the baby is born.  A dark line from your belly button to the pubic area (linea nigra) may appear. This will likely fade after the  baby is born. WHAT TO EXPECT AT YOUR PRENATAL VISITS During a routine prenatal visit:  You will be weighed to make sure you and the fetus are growing normally.  Your blood pressure will be taken.  Your abdomen will be measured to track your baby's growth.  The fetal heartbeat will be listened to.  Any test results from the previous visit will be discussed. Your caregiver may ask you:  How you are feeling.  If you are feeling the baby move.  If you have had any abnormal symptoms, such as leaking fluid, bleeding, severe headaches, or abdominal cramping.  If you have any questions. Other tests that may be performed during your second trimester include:  Blood tests that check for:  Low iron levels (anemia).  Gestational diabetes (between 24 and 28 weeks).  Rh antibodies.  Urine tests to check for infections, diabetes, or protein in the urine.  An ultrasound to confirm the proper growth and development of the baby.  An amniocentesis to check for possible genetic problems.  Fetal screens for spina bifida and Down syndrome. HOME CARE INSTRUCTIONS   Avoid all smoking, herbs, alcohol, and unprescribed drugs. These chemicals affect the formation and growth of the baby.  Follow your caregiver's instructions regarding medicine use. There are medicines that are either safe or unsafe to take during pregnancy.  Exercise only as directed by your caregiver. Experiencing uterine cramps is a good sign to stop exercising.  Continue to eat regular,   healthy meals.  Wear a good support bra for breast tenderness.  Do not use hot tubs, steam rooms, or saunas.  Wear your seat belt at all times when driving.  Avoid raw meat, uncooked cheese, cat litter boxes, and soil used by cats. These carry germs that can cause birth defects in the baby.  Take your prenatal vitamins.  Try taking a stool softener (if your caregiver approves) if you develop constipation. Eat more high-fiber foods,  such as fresh vegetables or fruit and whole grains. Drink plenty of fluids to keep your urine clear or pale yellow.  Take warm sitz baths to soothe any pain or discomfort caused by hemorrhoids. Use hemorrhoid cream if your caregiver approves.  If you develop varicose veins, wear support hose. Elevate your feet for 15 minutes, 3 4 times a day. Limit salt in your diet.  Avoid heavy lifting, wear low heel shoes, and practice good posture.  Rest with your legs elevated if you have leg cramps or low back pain.  Visit your dentist if you have not gone yet during your pregnancy. Use a soft toothbrush to brush your teeth and be gentle when you floss.  A sexual relationship may be continued unless your caregiver directs you otherwise.  Continue to go to all your prenatal visits as directed by your caregiver. SEEK MEDICAL CARE IF:   You have dizziness.  You have mild pelvic cramps, pelvic pressure, or nagging pain in the abdominal area.  You have persistent nausea, vomiting, or diarrhea.  You have a bad smelling vaginal discharge.  You have pain with urination. SEEK IMMEDIATE MEDICAL CARE IF:   You have a fever.  You are leaking fluid from your vagina.  You have spotting or bleeding from your vagina.  You have severe abdominal cramping or pain.  You have rapid weight gain or loss.  You have shortness of breath with chest pain.  You notice sudden or extreme swelling of your face, hands, ankles, feet, or legs.  You have not felt your baby move in over an hour.  You have severe headaches that do not go away with medicine.  You have vision changes. Document Released: 11/19/2001 Document Revised: 07/28/2013 Document Reviewed: 01/26/2013 [employer] Patient Information 2014 [employer].  

## 2014-02-24 ENCOUNTER — Ambulatory Visit (HOSPITAL_COMMUNITY)
Admission: RE | Admit: 2014-02-24 | Discharge: 2014-02-24 | Disposition: A | Discharge status: Home / Self Care | Payer: Medicaid Other | Source: Ambulatory Visit | Attending: Family | Admitting: Family

## 2014-02-24 ENCOUNTER — Encounter: Payer: Self-pay | Admitting: Family

## 2014-02-24 DIAGNOSIS — Z3490 Encounter for supervision of normal pregnancy, unspecified, unspecified trimester: Secondary | ICD-10-CM

## 2014-02-24 DIAGNOSIS — Z3689 Encounter for other specified antenatal screening: Secondary | ICD-10-CM | POA: Insufficient documentation

## 2014-02-24 LAB — AFP, QUAD SCREEN
AFP: 69.4 [IU]/mL
CURR GEST AGE: 19 wks.days
Down Syndrome Scr Risk Est: <1:38500 {titer}
HCG TOTAL: 15884 m[IU]/mL
INH: 471.1 pg/mL
Interpretation-AFP: NEGATIVE
MOM FOR AFP: 1.62
MoM for INH: 2.73
MoM for hCG: 1
Open Spina bifida: NEGATIVE
Osb Risk: 1:2670 {titer}
Tri 18 Scr Risk Est: NEGATIVE
Trisomy 18 (Edward) Syndrome Interp.: 1:123000 {titer}
uE3 Mom: 1.43
uE3 Value: 1.4 ng/mL

## 2014-02-25 ENCOUNTER — Telehealth: Payer: Self-pay | Admitting: *Deleted

## 2014-02-25 NOTE — Telephone Encounter (Signed)
Message copied by Dorothyann PengHAIZLIP, Quincy Boy E on Fri Feb 25, 2014 11:55 AM ------      Message from: Vale HavenBECK, KELI L      Created: Thu Feb 24, 2014  5:02 PM       Can you let her know her quad was neg? ------

## 2014-02-25 NOTE — Telephone Encounter (Signed)
Spoke with patient concerning results, pt verbalized understanding.

## 2014-03-23 ENCOUNTER — Encounter: Payer: Self-pay | Admitting: Obstetrics and Gynecology

## 2014-03-23 ENCOUNTER — Ambulatory Visit (INDEPENDENT_AMBULATORY_CARE_PROVIDER_SITE_OTHER): Payer: Medicaid Other | Admitting: Obstetrics and Gynecology

## 2014-03-23 VITALS — BP 117/69 | Wt 178.2 lb

## 2014-03-23 DIAGNOSIS — Z3480 Encounter for supervision of other normal pregnancy, unspecified trimester: Secondary | ICD-10-CM

## 2014-03-23 DIAGNOSIS — Z3490 Encounter for supervision of normal pregnancy, unspecified, unspecified trimester: Secondary | ICD-10-CM

## 2014-03-23 LAB — POCT URINALYSIS DIP (DEVICE)
BILIRUBIN URINE: NEGATIVE
Glucose, UA: NEGATIVE mg/dL
Hgb urine dipstick: NEGATIVE
Ketones, ur: NEGATIVE mg/dL
LEUKOCYTES UA: NEGATIVE
NITRITE: NEGATIVE
PH: 5.5 (ref 5.0–8.0)
Protein, ur: NEGATIVE mg/dL
Specific Gravity, Urine: <=1.005 (ref 1.005–1.030)
Urobilinogen, UA: 0.2 mg/dL (ref 0.0–1.0)

## 2014-03-23 NOTE — Progress Notes (Signed)
Pulse: 100 Needs followup us scheduled.

## 2014-03-23 NOTE — Patient Instructions (Signed)
Smoking Cessation Quitting smoking is important to your health and has many advantages. However, it is not always easy to quit since nicotine is a very addictive drug. Often times, people try 3 times or more before being able to quit. This document explains the best ways for you to prepare to quit smoking. Quitting takes hard work and a lot of effort, but you can do it. ADVANTAGES OF QUITTING SMOKING  You will live longer, feel better, and live better.  Your body will feel the impact of quitting smoking almost immediately.  Within 20 minutes, blood pressure decreases. Your pulse returns to its normal level.  After 8 hours, carbon monoxide levels in the blood return to normal. Your oxygen level increases.  After 24 hours, the chance of having a heart attack starts to decrease. Your breath, hair, and body stop smelling like smoke.  After 48 hours, damaged nerve endings begin to recover. Your sense of taste and smell improve.  After 72 hours, the body is virtually free of nicotine. Your bronchial tubes relax and breathing becomes easier.  After 2 to 12 weeks, lungs can hold more air. Exercise becomes easier and circulation improves.  The risk of having a heart attack, stroke, cancer, or lung disease is greatly reduced.  After 1 year, the risk of coronary heart disease is cut in half.  After 5 years, the risk of stroke falls to the same as a nonsmoker.  After 10 years, the risk of lung cancer is cut in half and the risk of other cancers decreases significantly.  After 15 years, the risk of coronary heart disease drops, usually to the level of a nonsmoker.  If you are pregnant, quitting smoking will improve your chances of having a healthy baby.  The people you live with, especially any children, will be healthier.  You will have extra money to spend on things other than cigarettes. QUESTIONS TO THINK ABOUT BEFORE ATTEMPTING TO QUIT You may want to talk about your answers with your  caregiver.  Why do you want to quit?  If you tried to quit in the past, what helped and what did not?  What will be the most difficult situations for you after you quit? How will you plan to handle them?  Who can help you through the tough times? Your family? Friends? A caregiver?  What pleasures do you get from smoking? What ways can you still get pleasure if you quit? Here are some questions to ask your caregiver:  How can you help me to be successful at quitting?  What medicine do you think would be best for me and how should I take it?  What should I do if I need more help?  What is smoking withdrawal like? How can I get information on withdrawal? GET READY  Set a quit date.  Change your environment by getting rid of all cigarettes, ashtrays, matches, and lighters in your home, car, or work. Do not let people smoke in your home.  Review your past attempts to quit. Think about what worked and what did not. GET SUPPORT AND ENCOURAGEMENT You have a better chance of being successful if you have help. You can get support in many ways.  Tell your family, friends, and co-workers that you are going to quit and need their support. Ask them not to smoke around you.  Get individual, group, or telephone counseling and support. Programs are available at local hospitals and health centers. Call your local health department for   information about programs in your area.  Spiritual beliefs and practices may help some smokers quit.  Download a "quit meter" on your computer to keep track of quit statistics, such as how long you have gone without smoking, cigarettes not smoked, and money saved.  Get a self-help book about quitting smoking and staying off of tobacco. LEARN NEW SKILLS AND BEHAVIORS  Distract yourself from urges to smoke. Talk to someone, go for a walk, or occupy your time with a task.  Change your normal routine. Take a different route to work. Drink tea instead of coffee.  Eat breakfast in a different place.  Reduce your stress. Take a hot bath, exercise, or read a book.  Plan something enjoyable to do every day. Reward yourself for not smoking.  Explore interactive web-based programs that specialize in helping you quit. GET MEDICINE AND USE IT CORRECTLY Medicines can help you stop smoking and decrease the urge to smoke. Combining medicine with the above behavioral methods and support can greatly increase your chances of successfully quitting smoking.  Nicotine replacement therapy helps deliver nicotine to your body without the negative effects and risks of smoking. Nicotine replacement therapy includes nicotine gum, lozenges, inhalers, nasal sprays, and skin patches. Some may be available over-the-counter and others require a prescription.  Antidepressant medicine helps people abstain from smoking, but how this works is unknown. This medicine is available by prescription.  Nicotinic receptor partial agonist medicine simulates the effect of nicotine in your brain. This medicine is available by prescription. Ask your caregiver for advice about which medicines to use and how to use them based on your health history. Your caregiver will tell you what side effects to look out for if you choose to be on a medicine or therapy. Carefully read the information on the package. Do not use any other product containing nicotine while using a nicotine replacement product.  RELAPSE OR DIFFICULT SITUATIONS Most relapses occur within the first 3 months after quitting. Do not be discouraged if you start smoking again. Remember, most people try several times before finally quitting. You may have symptoms of withdrawal because your body is used to nicotine. You may crave cigarettes, be irritable, feel very hungry, cough often, get headaches, or have difficulty concentrating. The withdrawal symptoms are only temporary. They are strongest when you first quit, but they will go away within  10 14 days. To reduce the chances of relapse, try to:  Avoid drinking alcohol. Drinking lowers your chances of successfully quitting.  Reduce the amount of caffeine you consume. Once you quit smoking, the amount of caffeine in your body increases and can give you symptoms, such as a rapid heartbeat, sweating, and anxiety.  Avoid smokers because they can make you want to smoke.  Do not let weight gain distract you. Many smokers will gain weight when they quit, usually less than 10 pounds. Eat a healthy diet and stay active. You can always lose the weight gained after you quit.  Find ways to improve your mood other than smoking. FOR MORE INFORMATION  www.smokefree.gov  Document Released: 11/19/2001 Document Revised: 05/26/2012 Document Reviewed: 03/05/2012 Exit[employer] Patient Information 2014 [employer].  

## 2014-03-23 NOTE — Progress Notes (Signed)
Again urged smoking cessation. F/U US scheduled to complete anatomic survey. Reviewed normal labs.

## 2014-03-31 ENCOUNTER — Ambulatory Visit (HOSPITAL_COMMUNITY)
Admission: RE | Admit: 2014-03-31 | Discharge: 2014-03-31 | Disposition: A | Discharge status: Home / Self Care | Payer: Medicaid Other | Source: Ambulatory Visit | Attending: Obstetrics and Gynecology | Admitting: Obstetrics and Gynecology

## 2014-03-31 DIAGNOSIS — Z3689 Encounter for other specified antenatal screening: Secondary | ICD-10-CM | POA: Insufficient documentation

## 2014-03-31 DIAGNOSIS — Z3490 Encounter for supervision of normal pregnancy, unspecified, unspecified trimester: Secondary | ICD-10-CM

## 2014-04-04 ENCOUNTER — Encounter: Payer: Self-pay | Admitting: *Deleted

## 2014-04-20 ENCOUNTER — Ambulatory Visit (INDEPENDENT_AMBULATORY_CARE_PROVIDER_SITE_OTHER): Payer: Medicaid Other | Admitting: Advanced Practice Midwife

## 2014-04-20 VITALS — BP 111/67 | HR 98 | Wt 187.3 lb

## 2014-04-20 DIAGNOSIS — Z3480 Encounter for supervision of other normal pregnancy, unspecified trimester: Secondary | ICD-10-CM

## 2014-04-20 DIAGNOSIS — Z23 Encounter for immunization: Secondary | ICD-10-CM

## 2014-04-20 DIAGNOSIS — Z3490 Encounter for supervision of normal pregnancy, unspecified, unspecified trimester: Secondary | ICD-10-CM

## 2014-04-20 LAB — CBC
HCT: 31.6 % — ABNORMAL LOW (ref 36.0–46.0)
Hemoglobin: 10.5 g/dL — ABNORMAL LOW (ref 12.0–15.0)
MCH: 31.3 pg (ref 26.0–34.0)
MCHC: 33.2 g/dL (ref 30.0–36.0)
MCV: 94 fL (ref 78.0–100.0)
PLATELETS: 383 10*3/uL (ref 150–400)
RBC: 3.36 MIL/uL — ABNORMAL LOW (ref 3.87–5.11)
RDW: 13.3 % (ref 11.5–15.5)
WBC: 10.6 10*3/uL — ABNORMAL HIGH (ref 4.0–10.5)

## 2014-04-20 LAB — POCT URINALYSIS DIP (DEVICE)
Bilirubin Urine: NEGATIVE
Glucose, UA: NEGATIVE mg/dL
Hgb urine dipstick: NEGATIVE
Ketones, ur: NEGATIVE mg/dL
LEUKOCYTES UA: NEGATIVE
NITRITE: NEGATIVE
PH: 7 (ref 5.0–8.0)
PROTEIN: NEGATIVE mg/dL
Specific Gravity, Urine: 1.015 (ref 1.005–1.030)
UROBILINOGEN UA: 0.2 mg/dL (ref 0.0–1.0)

## 2014-04-20 MED ORDER — TETANUS-DIPHTH-ACELL PERTUSSIS 5-2.5-18.5 LF-MCG/0.5 IM SUSP: 0.5000 mL | Freq: Once | INTRAMUSCULAR | Status: DC

## 2014-04-20 NOTE — Assessment & Plan Note (Signed)
Tdap:  04/20/2014

## 2014-04-20 NOTE — Progress Notes (Signed)
Doing well except for congestion and scratchy throat from allergies.  Rec Claritin or zyrtec, sudafed, lozenges.  Glucola  Today.

## 2014-04-20 NOTE — Patient Instructions (Signed)
Glucose Tolerance Test During Pregnancy  The glucose tolerance test (GTT) or 3-hour glucose test can be used to determine if a woman has diabetes that first begins or is first recognized during pregnancy (gestational diabetes). Typically, a GTT is done after you have had a 1-hour glucose test with results that indicate you possibly have gestational diabetes.   The test takes about 3 hours. There will be a series of blood tests after you drink the sugar water solution. You must remain at the testing location to make sure that your blood is drawn on time.   LET YOUR CAREGIVER KNOW ABOUT:  · Allergies to food or medicine.  · Medicines taken, including vitamins, herbs, eyedrops, over-the-counter medicines, and creams.  · Any recent illnesses or infections.  BEFORE THE PROCEDURE  The GTT is a fasting test, meaning you must stop eating for a certain amount of time. The test will be the most accurate if you have not eaten for 8 12 hours before the test. For this reason, it is recommended that you have this test done in the morning before you have breakfast.  PROCEDURE   Do not eat or drink anything but water during the test. When you arrive at the lab, a sample of your blood is taken to get your fasting blood glucose level. After your fasting glucose level is determined, you will be given a sugar water solution to drink. You will be asked to wait in a certain area until your next blood test. The blood tests are done each hour for 3 hours. Stay close to the lab so your blood samples can be taken on time. This is important. If the blood samples are not taken on time, the test will need to be done again on another day.   AFTER THE PROCEDURE  · You can eat and drink as usual.    · Ask when your test results will be ready. Make sure you get your test results. A positive test is considered when two of the four blood test values are equal or above the normal blood glucose level.  Document Released: 05/26/2012 Document Reviewed:  05/26/2012  Exit[employer]® Patient Information ©2014 [employer].

## 2014-04-21 LAB — GLUCOSE TOLERANCE, 1 HOUR (50G) W/O FASTING: Glucose, 1 Hour GTT: 176 mg/dL — ABNORMAL HIGH (ref 70–140)

## 2014-04-21 LAB — RPR

## 2014-04-21 LAB — HIV ANTIBODY (ROUTINE TESTING W REFLEX): HIV 1&2 Ab, 4th Generation: NONREACTIVE

## 2014-05-04 ENCOUNTER — Encounter: Payer: Self-pay | Admitting: Family Medicine

## 2014-05-04 ENCOUNTER — Ambulatory Visit (INDEPENDENT_AMBULATORY_CARE_PROVIDER_SITE_OTHER): Payer: Medicaid Other | Admitting: Family Medicine

## 2014-05-04 VITALS — BP 112/79 | HR 102 | Temp 97.7°F

## 2014-05-04 DIAGNOSIS — Z3490 Encounter for supervision of normal pregnancy, unspecified, unspecified trimester: Secondary | ICD-10-CM

## 2014-05-04 DIAGNOSIS — Z3480 Encounter for supervision of other normal pregnancy, unspecified trimester: Secondary | ICD-10-CM

## 2014-05-04 LAB — POCT URINALYSIS DIP (DEVICE)
BILIRUBIN URINE: NEGATIVE
Glucose, UA: NEGATIVE mg/dL
HGB URINE DIPSTICK: NEGATIVE
KETONES UR: NEGATIVE mg/dL
Leukocytes, UA: NEGATIVE
Nitrite: NEGATIVE
PH: 6.5 (ref 5.0–8.0)
Protein, ur: NEGATIVE mg/dL
Specific Gravity, Urine: 1.015 (ref 1.005–1.030)
Urobilinogen, UA: 0.2 mg/dL (ref 0.0–1.0)

## 2014-05-04 NOTE — Progress Notes (Signed)
+  FM, no LOF, no VB, no Ctx Smoking cessation encouraged abnl 1hr - needs 3hr No complaints  Tieshia L Koder is a 28 y.o. G2P0010 at [redacted]w[redacted]d by R=9 here for ROB visit.  Discussed with Patient:  -Plans to breast feed.  All questions answered. -Continue prenatal vitamins. - Routine precautions discussed (depression, infection s/s).   Patient provided with all pertinent phone numbers for emergencies. - RTC for any VB, regular, painful cramps/ctxs occurring at a rate of >2/10 min, fever (100.5 or higher), n/v/d, any pain that is unresolving or worsening, LOF, decreased fetal movement, CP, SOB, edema  Problems: Patient Active Problem List   Diagnosis Date Noted  . Supervision of normal pregnancy 01/25/2014  . Trichomonal vaginitis in pregnancy in first trimester 01/25/2014  . GASTROENTERITIS, ACUTE 01/26/2008  . NAUSEA WITH VOMITING 01/26/2008  . TOBACCO ABUSE 08/11/2007  . DERMATITIS, CONTACT, NOS 08/11/2007    To Do: 1. Glucose tolerance test ordered.  Patient will draw in clinic.  Will f/u test and amend plan based on results.  [ ]  Vaccines: recd [ ]  BCM: consider LARC  Edu: [ x] PTL precautions; [ ]  BF class; [ ]  childbirth class; [ ]   BF counseling;

## 2014-05-04 NOTE — Patient Instructions (Signed)
Third Trimester of Pregnancy  The third trimester is from week 29 through week 42, months 7 through 9. The third trimester is a time when the fetus is growing rapidly. At the end of the ninth month, the fetus is about 20 inches in length and weighs 6 10 pounds.   BODY CHANGES  Your body goes through many changes during pregnancy. The changes vary from woman to woman.    Your weight will continue to increase. You can expect to gain 25 35 pounds (11 16 kg) by the end of the pregnancy.   You may begin to get stretch marks on your hips, abdomen, and breasts.   You may urinate more often because the fetus is moving lower into your pelvis and pressing on your bladder.   You may develop or continue to have heartburn as a result of your pregnancy.   You may develop constipation because certain hormones are causing the muscles that push waste through your intestines to slow down.   You may develop hemorrhoids or swollen, bulging veins (varicose veins).   You may have pelvic pain because of the weight gain and pregnancy hormones relaxing your joints between the bones in your pelvis. Back aches may result from over exertion of the muscles supporting your posture.   Your breasts will continue to grow and be tender. A yellow discharge may leak from your breasts called colostrum.   Your belly button may stick out.   You may feel short of breath because of your expanding uterus.   You may notice the fetus "dropping," or moving lower in your abdomen.   You may have a bloody mucus discharge. This usually occurs a few days to a week before labor begins.   Your cervix becomes thin and soft (effaced) near your due date.  WHAT TO EXPECT AT YOUR PRENATAL EXAMS   You will have prenatal exams every 2 weeks until week 36. Then, you will have weekly prenatal exams. During a routine prenatal visit:   You will be weighed to make sure you and the fetus are growing normally.   Your blood pressure is taken.   Your abdomen will be  measured to track your baby's growth.   The fetal heartbeat will be listened to.   Any test results from the previous visit will be discussed.   You may have a cervical check near your due date to see if you have effaced.  At around 36 weeks, your caregiver will check your cervix. At the same time, your caregiver will also perform a test on the secretions of the vaginal tissue. This test is to determine if a type of bacteria, Group B streptococcus, is present. Your caregiver will explain this further.  Your caregiver may ask you:   What your birth plan is.   How you are feeling.   If you are feeling the baby move.   If you have had any abnormal symptoms, such as leaking fluid, bleeding, severe headaches, or abdominal cramping.   If you have any questions.  Other tests or screenings that may be performed during your third trimester include:   Blood tests that check for low iron levels (anemia).   Fetal testing to check the health, activity level, and growth of the fetus. Testing is done if you have certain medical conditions or if there are problems during the pregnancy.  FALSE LABOR  You may feel small, irregular contractions that eventually go away. These are called Braxton Hicks contractions, or   false labor. Contractions may last for hours, days, or even weeks before true labor sets in. If contractions come at regular intervals, intensify, or become painful, it is best to be seen by your caregiver.   SIGNS OF LABOR    Menstrual-like cramps.   Contractions that are 5 minutes apart or less.   Contractions that start on the top of the uterus and spread down to the lower abdomen and back.   A sense of increased pelvic pressure or back pain.   A watery or bloody mucus discharge that comes from the vagina.  If you have any of these signs before the 37th week of pregnancy, call your caregiver right away. You need to go to the hospital to get checked immediately.  HOME CARE INSTRUCTIONS    Avoid all  smoking, herbs, alcohol, and unprescribed drugs. These chemicals affect the formation and growth of the baby.   Follow your caregiver's instructions regarding medicine use. There are medicines that are either safe or unsafe to take during pregnancy.   Exercise only as directed by your caregiver. Experiencing uterine cramps is a good sign to stop exercising.   Continue to eat regular, healthy meals.   Wear a good support bra for breast tenderness.   Do not use hot tubs, steam rooms, or saunas.   Wear your seat belt at all times when driving.   Avoid raw meat, uncooked cheese, cat litter boxes, and soil used by cats. These carry germs that can cause birth defects in the baby.   Take your prenatal vitamins.   Try taking a stool softener (if your caregiver approves) if you develop constipation. Eat more high-fiber foods, such as fresh vegetables or fruit and whole grains. Drink plenty of fluids to keep your urine clear or pale yellow.   Take warm sitz baths to soothe any pain or discomfort caused by hemorrhoids. Use hemorrhoid cream if your caregiver approves.   If you develop varicose veins, wear support hose. Elevate your feet for 15 minutes, 3 4 times a day. Limit salt in your diet.   Avoid heavy lifting, wear low heal shoes, and practice good posture.   Rest a lot with your legs elevated if you have leg cramps or low back pain.   Visit your dentist if you have not gone during your pregnancy. Use a soft toothbrush to brush your teeth and be gentle when you floss.   A sexual relationship may be continued unless your caregiver directs you otherwise.   Do not travel far distances unless it is absolutely necessary and only with the approval of your caregiver.   Take prenatal classes to understand, practice, and ask questions about the labor and delivery.   Make a trial run to the hospital.   Pack your hospital bag.   Prepare the baby's nursery.   Continue to go to all your prenatal visits as directed  by your caregiver.  SEEK MEDICAL CARE IF:   You are unsure if you are in labor or if your water has broken.   You have dizziness.   You have mild pelvic cramps, pelvic pressure, or nagging pain in your abdominal area.   You have persistent nausea, vomiting, or diarrhea.   You have a bad smelling vaginal discharge.   You have pain with urination.  SEEK IMMEDIATE MEDICAL CARE IF:    You have a fever.   You are leaking fluid from your vagina.   You have spotting or bleeding from your vagina.     You have severe abdominal cramping or pain.   You have rapid weight loss or gain.   You have shortness of breath with chest pain.   You notice sudden or extreme swelling of your face, hands, ankles, feet, or legs.   You have not felt your baby move in over an hour.   You have severe headaches that do not go away with medicine.   You have vision changes.  Document Released: 11/19/2001 Document Revised: 07/28/2013 Document Reviewed: 01/26/2013  [employer] Patient Information 2014 [employer].

## 2014-05-09 ENCOUNTER — Other Ambulatory Visit: Payer: Medicaid Other

## 2014-05-09 DIAGNOSIS — R7309 Other abnormal glucose: Secondary | ICD-10-CM

## 2014-05-10 LAB — GLUCOSE TOLERANCE, 3 HOURS
GLUCOSE 3 HOUR GTT: 99 mg/dL (ref 70–144)
GLUCOSE, 1 HOUR-GESTATIONAL: 197 mg/dL — AB (ref 70–189)
GLUCOSE, 2 HOUR-GESTATIONAL: 194 mg/dL — AB (ref 70–164)
Glucose Tolerance, Fasting: 77 mg/dL (ref 70–104)

## 2014-05-11 ENCOUNTER — Telehealth: Payer: Self-pay

## 2014-05-11 NOTE — Telephone Encounter (Signed)
Attempted to call patient. No answer. Left message stating we are calling with results and to set up an appointment, please call clinic. Patient needs to have DM education appointment Monday morning, cancel LOB f/u Wednesday and have appointment for [facility] OB FU 05/23/14.

## 2014-05-11 NOTE — Telephone Encounter (Signed)
Message copied by Louanna Raw on Wed May 11, 2014  9:53 AM ------      Message from: Jolyn Lent R      Created: Wed May 11, 2014  8:36 AM       Pt has diabetes, please schedule for [facility] and Visit wth diabetes coordinator.      Tawana Scale, MD      OB Fellow       ------

## 2014-05-12 NOTE — Telephone Encounter (Signed)
Attempted to call patient. Husband answered and stated patient is not home. He stated he would have patient call us when she gets home.

## 2014-05-16 NOTE — Telephone Encounter (Signed)
Diabetic education scheduled for 05/19/14 in maternal fetal care at 1530. Called patient and informed her of appointment in maternal fetal care. Informed her she will be given education and a glucometer to start checking her sugars-- advised that she bring her log to appointment next Monday. Patient verbalized understanding--no questions or concerns.

## 2014-05-19 ENCOUNTER — Ambulatory Visit (HOSPITAL_COMMUNITY)
Admission: RE | Admit: 2014-05-19 | Discharge: 2014-05-19 | Disposition: A | Discharge status: Home / Self Care | Payer: Medicaid Other | Source: Ambulatory Visit | Attending: Family Medicine | Admitting: Family Medicine

## 2014-05-19 ENCOUNTER — Encounter: Payer: Medicaid Other | Attending: Obstetrics and Gynecology | Admitting: *Deleted

## 2014-05-19 DIAGNOSIS — O99810 Abnormal glucose complicating pregnancy: Secondary | ICD-10-CM | POA: Diagnosis not present

## 2014-05-19 DIAGNOSIS — Z713 Dietary counseling and surveillance: Secondary | ICD-10-CM | POA: Insufficient documentation

## 2014-05-19 DIAGNOSIS — O99330 Smoking (tobacco) complicating pregnancy, unspecified trimester: Secondary | ICD-10-CM | POA: Insufficient documentation

## 2014-05-19 NOTE — Progress Notes (Addendum)
  DIABETES & NUTRITION  Patient was seen on 05/19/14 for Gestational Diabetes self-management . The following learning objectives were met by the patient:   States the definition of Gestational Diabetes  States why dietary management is important in controlling blood glucose  Describes the effects of carbohydrates on blood glucose levels  Demonstrates ability to create a balanced meal plan  Demonstrates carbohydrate counting   States when to check blood glucose levels  Demonstrates proper blood glucose monitoring techniques  States the effect of stress and exercise on blood glucose levels  States the importance of limiting caffeine and abstaining from alcohol and smoking  Plan:  Aim for 2 Carb Choices per meal (30 grams) +/- 1 either way for breakfast Aim for 3 Carb Choices per meal (45 grams) +/- 1 either way from lunch and dinner Aim for 1-2 Carbs per snack Begin reading food labels for Total Carbohydrate and sugar grams of foods Consider  increasing your activity level by walking daily as tolerated Begin checking BG before breakfast and 2 hours after first bit of breakfast, lunch and dinner after  as directed by MD  Take medication  as directed by MD  Blood glucose monitor given: Accu Chek Nano BG Monitoring Kit Lot # [id number] Exp: 12/08/14 Blood glucose reading: 188m/dl  Patient instructed to monitor glucose levels: FBS: 60 - <90 2 hour: <120  Patient received the following handouts:  Nutrition Diabetes and Pregnancy  Carbohydrate Counting List  Meal Planning worksheet  Patient will be seen for follow-up as needed.  [facility] Accu-Ck Nano for testing 4X daily. If difficulty with insurance please dispense accu-Ck Aviva Meter and test strips.

## 2014-05-23 ENCOUNTER — Ambulatory Visit (INDEPENDENT_AMBULATORY_CARE_PROVIDER_SITE_OTHER): Payer: Medicaid Other | Admitting: Family Medicine

## 2014-05-23 ENCOUNTER — Encounter: Payer: Self-pay | Admitting: Family Medicine

## 2014-05-23 VITALS — BP 115/81 | HR 117 | Temp 97.7°F | Wt 192.7 lb

## 2014-05-23 DIAGNOSIS — O99810 Abnormal glucose complicating pregnancy: Secondary | ICD-10-CM

## 2014-05-23 DIAGNOSIS — O24419 Gestational diabetes mellitus in pregnancy, unspecified control: Secondary | ICD-10-CM

## 2014-05-23 DIAGNOSIS — Z3490 Encounter for supervision of normal pregnancy, unspecified, unspecified trimester: Secondary | ICD-10-CM

## 2014-05-23 DIAGNOSIS — Z3480 Encounter for supervision of other normal pregnancy, unspecified trimester: Secondary | ICD-10-CM

## 2014-05-23 DIAGNOSIS — F17200 Nicotine dependence, unspecified, uncomplicated: Secondary | ICD-10-CM

## 2014-05-23 LAB — POCT URINALYSIS DIP (DEVICE)
Bilirubin Urine: NEGATIVE
Glucose, UA: NEGATIVE mg/dL
KETONES UR: NEGATIVE mg/dL
LEUKOCYTES UA: NEGATIVE
NITRITE: NEGATIVE
PH: 6.5 (ref 5.0–8.0)
Protein, ur: NEGATIVE mg/dL
Specific Gravity, Urine: 1.015 (ref 1.005–1.030)
Urobilinogen, UA: 0.2 mg/dL (ref 0.0–1.0)

## 2014-05-23 MED ORDER — BUPROPION HCL ER (SR) 150 MG PO TB12: 150.0000 mg | ORAL_TABLET | Freq: Two times a day (BID) | ORAL | Status: AC

## 2014-05-23 MED ORDER — GLYBURIDE 2.5 MG PO TABS: 2.5000 mg | ORAL_TABLET | Freq: Two times a day (BID) | ORAL | Status: DC

## 2014-05-23 NOTE — Progress Notes (Signed)
F 97/93 B 125/100 L 147/175/126 D 135/103  Will start glyburide 2.5mg  BID today. F/u 1 week No complaints +FM, no lof, no vb, no ctx  Taylor Ford NailsSegraves is a 28 y.o. G2P0010 at 1054w5d  here for ROB visit.  Discussed with Patient:  -Plans to breast feed.  All questions answered. -Continue prenatal vitamins. Reviewed fetal kick counts (Pt to perform daily at a time when the baby is active, lie laterally with both hands on belly in quiet room and count all movements (hiccups, shoulder rolls, obvious kicks, etc); pt is to report to clinic or MAU for less than 10 movements felt in a one hour time period-pt told as soon as she counts 10 movements the count is complete.)  - Routine precautions discussed (depression, infection s/s).   Patient provided with all pertinent phone numbers for emergencies. - RTC for any VB, regular, painful cramps/ctxs occurring at a rate of >2/10 min, fever (100.5 or higher), n/v/d, any pain that is unresolving or worsening, LOF, decreased fetal movement, CP, SOB, edema  Problems: Patient Active Problem List   Diagnosis Date Noted  . Supervision of normal pregnancy 01/25/2014  . Trichomonal vaginitis in pregnancy in first trimester 01/25/2014  . GASTROENTERITIS, ACUTE 01/26/2008  . NAUSEA WITH VOMITING 01/26/2008  . TOBACCO ABUSE 08/11/2007  . DERMATITIS, CONTACT, NOS 08/11/2007    To Do:  [ ]  Vaccines: recd [ ]  BCM: OCP  Edu: [x ] PTL precautions; [ ]  BF class; [ ]  childbirth class; [ ]   BF counseling;

## 2014-05-23 NOTE — Progress Notes (Addendum)
Start zyban for smoking cessation.  Start NST next week

## 2014-05-23 NOTE — Progress Notes (Signed)
No complaints

## 2014-05-23 NOTE — Patient Instructions (Signed)
Bupropion sustained-release tablets (smoking cessation) What is this medicine? BUPROPION (byoo PROE pee on) is used to help people quit smoking. This medicine may be used for other purposes; ask your health care provider or pharmacist if you have questions. COMMON BRAND NAME(S): Buproban, Zyban What should I tell my health care provider before I take this medicine? They need to know if you have any of these conditions: -an eating disorder, such as anorexia or bulimia -bipolar disorder or psychosis -diabetes or high blood sugar, treated with medication -glaucoma -head injury or brain tumor -heart disease, previous heart attack, or irregular heart beat -high blood pressure -kidney or liver disease -seizures -suicidal thoughts or a previous suicide attempt -Tourette's syndrome -weight loss -an unusual or allergic reaction to bupropion, other medicines, foods, dyes, or preservatives -breast-feeding -pregnant or trying to become pregnant How should I use this medicine? Take this medicine by mouth with a glass of water. Follow the directions on the prescription label. You can take it with or without food. If it upsets your stomach, take it with food. Do not cut, crush or chew this medicine. Take your medicine at regular intervals. If you take this medicine more than once a day, take your second dose at least 8 hours after you take your first dose. To limit difficulty in sleeping, avoid taking this medicine at bedtime. Do not take your medicine more often than directed. Do not stop taking this medicine suddenly except upon the advice of your doctor. Stopping this medicine too quickly may cause serious side effects. A special MedGuide will be given to you by the pharmacist with each prescription and refill. Be sure to read this information carefully each time. Talk to your pediatrician regarding the use of this medicine in children. Special care may be needed. Overdosage: If you think you have  taken too much of this medicine contact a poison control center or emergency room at once. NOTE: This medicine is only for you. Do not share this medicine with others. What if I miss a dose? If you miss a dose, skip the missed dose and take your next tablet at the regular time. There should be at least 8 hours between doses. Do not take double or extra doses. What may interact with this medicine? Do not take this medicine with any of the following medications: -linezolid -MAOIs like Azilect, Carbex, Eldepryl, Marplan, Nardil, and Parnate -methylene blue (injected into a vein) -other medicines that contain bupropion like Wellbutrin This medicine may also interact with the following medications: -alcohol -certain medicines for anxiety or sleep -certain medicines for blood pressure like metoprolol, propranolol -certain medicines for depression or psychotic disturbances -certain medicines for HIV or AIDS like efavirenz, lopinavir, nelfinavir, ritonavir -certain medicines for irregular heart beat like propafenone, flecainide -certain medicines for Parkinson's disease like amantadine, levodopa -certain medicines for seizures like carbamazepine, phenytoin, phenobarbital -cimetidine -clopidogrel -cyclophosphamide -furazolidone -isoniazid -nicotine -orphenadrine -procarbazine -steroid medicines like prednisone or cortisone -stimulant medicines for attention disorders, weight loss, or to stay awake -tamoxifen -theophylline -thiotepa -ticlopidine -tramadol -warfarin This list may not describe all possible interactions. Give your health care provider a list of all the medicines, herbs, non-prescription drugs, or dietary supplements you use. Also tell them if you smoke, drink alcohol, or use illegal drugs. Some items may interact with your medicine. What should I watch for while using this medicine? Visit your doctor or health care professional for regular checks on your progress. This  medicine should be used together with a patient   support program. It is important to participate in a behavioral program, counseling, or other support program that is recommended by your health care professional. Patients and their families should watch out for new or worsening thoughts of suicide or depression. Also watch out for sudden changes in feelings such as feeling anxious, agitated, panicky, irritable, hostile, aggressive, impulsive, severely restless, overly excited and hyperactive, or not being able to sleep. If this happens, especially at the beginning of treatment or after a change in dose, call your health care professional. Avoid alcoholic drinks while taking this medicine. Drinking excessive alcoholic beverages, using sleeping or anxiety medicines, or quickly stopping the use of these agents while taking this medicine may increase your risk for a seizure. Do not drive or use heavy machinery until you know how this medicine affects you. This medicine can impair your ability to perform these tasks. Do not take this medicine close to bedtime. It may prevent you from sleeping. Your mouth may get dry. Chewing sugarless gum or sucking hard candy, and drinking plenty of water may help. Contact your doctor if the problem does not go away or is severe. Do not use nicotine patches or chewing gum without the advice of your doctor or health care professional while taking this medicine. You may need to have your blood pressure taken regularly if your doctor recommends that you use both nicotine and this medicine together. What side effects may I notice from receiving this medicine? Side effects that you should report to your doctor or health care professional as soon as possible: -allergic reactions like skin rash, itching or hives, swelling of the face, lips, or tongue -breathing problems -changes in vision -confusion -fast or irregular heartbeat -hallucinations -increased blood pressure -redness,  blistering, peeling or loosening of the skin, including inside the mouth -seizures -suicidal thoughts or other mood changes -unusually weak or tired -vomiting Side effects that usually do not require medical attention (report to your doctor or health care professional if they continue or are bothersome): -change in sex drive or performance -constipation -headache -loss of appetite -nausea -tremors -weight loss This list may not describe all possible side effects. Call your doctor for medical advice about side effects. You may report side effects to FDA at 1-800-FDA-1088. Where should I keep my medicine? Keep out of the reach of children. Store at room temperature between 20 and 25 degrees C (68 and 77 degrees F). Protect from light. Keep container tightly closed. Throw away any unused medicine after the expiration date. NOTE: This sheet is a summary. It may not cover all possible information. If you have questions about this medicine, talk to your doctor, pharmacist, or health care provider.  2014, [employer]. (2013-07-23 10:55:10) Third Trimester of Pregnancy The third trimester is from week 29 through week 42, months 7 through 9. The third trimester is a time when the fetus is growing rapidly. At the end of the ninth month, the fetus is about 20 inches in length and weighs 6 10 pounds.  BODY CHANGES Your body goes through many changes during pregnancy. The changes vary from woman to woman.   Your weight will continue to increase. You can expect to gain 25 35 pounds (11 16 kg) by the end of the pregnancy.  You may begin to get stretch marks on your hips, abdomen, and breasts.  You may urinate more often because the fetus is moving lower into your pelvis and pressing on your bladder.  You may develop or continue to  have heartburn as a result of your pregnancy.  You may develop constipation because certain hormones are causing the muscles that push waste through your  intestines to slow down.  You may develop hemorrhoids or swollen, bulging veins (varicose veins).  You may have pelvic pain because of the weight gain and pregnancy hormones relaxing your joints between the bones in your pelvis. Back aches may result from over exertion of the muscles supporting your posture.  Your breasts will continue to grow and be tender. A yellow discharge may leak from your breasts called colostrum.  Your belly button may stick out.  You may feel short of breath because of your expanding uterus.  You may notice the fetus "dropping," or moving lower in your abdomen.  You may have a bloody mucus discharge. This usually occurs a few days to a week before labor begins.  Your cervix becomes thin and soft (effaced) near your due date. WHAT TO EXPECT AT YOUR PRENATAL EXAMS  You will have prenatal exams every 2 weeks until week 36. Then, you will have weekly prenatal exams. During a routine prenatal visit:  You will be weighed to make sure you and the fetus are growing normally.  Your blood pressure is taken.  Your abdomen will be measured to track your baby's growth.  The fetal heartbeat will be listened to.  Any test results from the previous visit will be discussed.  You may have a cervical check near your due date to see if you have effaced. At around 36 weeks, your caregiver will check your cervix. At the same time, your caregiver will also perform a test on the secretions of the vaginal tissue. This test is to determine if a type of bacteria, Group B streptococcus, is present. Your caregiver will explain this further. Your caregiver may ask you:  What your birth plan is.  How you are feeling.  If you are feeling the baby move.  If you have had any abnormal symptoms, such as leaking fluid, bleeding, severe headaches, or abdominal cramping.  If you have any questions. Other tests or screenings that may be performed during your third trimester  include:  Blood tests that check for low iron levels (anemia).  Fetal testing to check the health, activity level, and growth of the fetus. Testing is done if you have certain medical conditions or if there are problems during the pregnancy. FALSE LABOR You may feel small, irregular contractions that eventually go away. These are called Braxton Hicks contractions, or false labor. Contractions may last for hours, days, or even weeks before true labor sets in. If contractions come at regular intervals, intensify, or become painful, it is best to be seen by your caregiver.  SIGNS OF LABOR   Menstrual-like cramps.  Contractions that are 5 minutes apart or less.  Contractions that start on the top of the uterus and spread down to the lower abdomen and back.  A sense of increased pelvic pressure or back pain.  A watery or bloody mucus discharge that comes from the vagina. If you have any of these signs before the 37th week of pregnancy, call your caregiver right away. You need to go to the hospital to get checked immediately. HOME CARE INSTRUCTIONS   Avoid all smoking, herbs, alcohol, and unprescribed drugs. These chemicals affect the formation and growth of the baby.  Follow your caregiver's instructions regarding medicine use. There are medicines that are either safe or unsafe to take during pregnancy.  Exercise only  as directed by your caregiver. Experiencing uterine cramps is a good sign to stop exercising.  Continue to eat regular, healthy meals.  Wear a good support bra for breast tenderness.  Do not use hot tubs, steam rooms, or saunas.  Wear your seat belt at all times when driving.  Avoid raw meat, uncooked cheese, cat litter boxes, and soil used by cats. These carry germs that can cause birth defects in the baby.  Take your prenatal vitamins.  Try taking a stool softener (if your caregiver approves) if you develop constipation. Eat more high-fiber foods, such as fresh  vegetables or fruit and whole grains. Drink plenty of fluids to keep your urine clear or pale yellow.  Take warm sitz baths to soothe any pain or discomfort caused by hemorrhoids. Use hemorrhoid cream if your caregiver approves.  If you develop varicose veins, wear support hose. Elevate your feet for 15 minutes, 3 4 times a day. Limit salt in your diet.  Avoid heavy lifting, wear low heal shoes, and practice good posture.  Rest a lot with your legs elevated if you have leg cramps or low back pain.  Visit your dentist if you have not gone during your pregnancy. Use a soft toothbrush to brush your teeth and be gentle when you floss.  A sexual relationship may be continued unless your caregiver directs you otherwise.  Do not travel far distances unless it is absolutely necessary and only with the approval of your caregiver.  Take prenatal classes to understand, practice, and ask questions about the labor and delivery.  Make a trial run to the hospital.  Pack your hospital bag.  Prepare the baby's nursery.  Continue to go to all your prenatal visits as directed by your caregiver. SEEK MEDICAL CARE IF:  You are unsure if you are in labor or if your water has broken.  You have dizziness.  You have mild pelvic cramps, pelvic pressure, or nagging pain in your abdominal area.  You have persistent nausea, vomiting, or diarrhea.  You have a bad smelling vaginal discharge.  You have pain with urination. SEEK IMMEDIATE MEDICAL CARE IF:   You have a fever.  You are leaking fluid from your vagina.  You have spotting or bleeding from your vagina.  You have severe abdominal cramping or pain.  You have rapid weight loss or gain.  You have shortness of breath with chest pain.  You notice sudden or extreme swelling of your face, hands, ankles, feet, or legs.  You have not felt your baby move in over an hour.  You have severe headaches that do not go away with medicine.  You have  vision changes. Document Released: 11/19/2001 Document Revised: 07/28/2013 Document Reviewed: 01/26/2013 [facility] Patient Information 2014 [place]Care, [place].

## 2014-05-30 ENCOUNTER — Ambulatory Visit (INDEPENDENT_AMBULATORY_CARE_PROVIDER_SITE_OTHER): Payer: Medicaid Other | Admitting: Obstetrics & Gynecology

## 2014-05-30 VITALS — BP 125/86 | HR 108 | Wt 190.9 lb

## 2014-05-30 DIAGNOSIS — O24419 Gestational diabetes mellitus in pregnancy, unspecified control: Secondary | ICD-10-CM

## 2014-05-30 DIAGNOSIS — O99810 Abnormal glucose complicating pregnancy: Secondary | ICD-10-CM

## 2014-05-30 LAB — POCT URINALYSIS DIP (DEVICE)
Bilirubin Urine: NEGATIVE
GLUCOSE, UA: 100 mg/dL — AB
Hgb urine dipstick: NEGATIVE
Ketones, ur: NEGATIVE mg/dL
LEUKOCYTES UA: NEGATIVE
Nitrite: NEGATIVE
PROTEIN: NEGATIVE mg/dL
SPECIFIC GRAVITY, URINE: 1.025 (ref 1.005–1.030)
Urobilinogen, UA: 0.2 mg/dL (ref 0.0–1.0)
pH: 6 (ref 5.0–8.0)

## 2014-05-30 LAB — US OB FOLLOW UP

## 2014-05-30 NOTE — Patient Instructions (Signed)
Third Trimester of Pregnancy The third trimester is from week 29 through week 42, months 7 through 9. The third trimester is a time when the fetus is growing rapidly. At the end of the ninth month, the fetus is about 20 inches in length and weighs 6-10 pounds.  BODY CHANGES Your body goes through many changes during pregnancy. The changes vary from woman to woman.   Your weight will continue to increase. You can expect to gain 25-35 pounds (11-16 kg) by the end of the pregnancy.  You may begin to get stretch marks on your hips, abdomen, and breasts.  You may urinate more often because the fetus is moving lower into your pelvis and pressing on your bladder.  You may develop or continue to have heartburn as a result of your pregnancy.  You may develop constipation because certain hormones are causing the muscles that push waste through your intestines to slow down.  You may develop hemorrhoids or swollen, bulging veins (varicose veins).  You may have pelvic pain because of the weight gain and pregnancy hormones relaxing your joints between the bones in your pelvis. Backaches may result from overexertion of the muscles supporting your posture.  You may have changes in your hair. These can include thickening of your hair, rapid growth, and changes in texture. Some women also have hair loss during or after pregnancy, or hair that feels dry or thin. Your hair will most likely return to normal after your baby is born.  Your breasts will continue to grow and be tender. A yellow discharge may leak from your breasts called colostrum.  Your belly button may stick out.  You may feel short of breath because of your expanding uterus.  You may notice the fetus "dropping," or moving lower in your abdomen.  You may have a bloody mucus discharge. This usually occurs a few days to a week before labor begins.  Your cervix becomes thin and soft (effaced) near your due date. WHAT TO EXPECT AT YOUR PRENATAL  EXAMS  You will have prenatal exams every 2 weeks until week 36. Then, you will have weekly prenatal exams. During a routine prenatal visit:  You will be weighed to make sure you and the fetus are growing normally.  Your blood pressure is taken.  Your abdomen will be measured to track your baby's growth.  The fetal heartbeat will be listened to.  Any test results from the previous visit will be discussed.  You may have a cervical check near your due date to see if you have effaced. At around 36 weeks, your caregiver will check your cervix. At the same time, your caregiver will also perform a test on the secretions of the vaginal tissue. This test is to determine if a type of bacteria, Group B streptococcus, is present. Your caregiver will explain this further. Your caregiver may ask you:  What your birth plan is.  How you are feeling.  If you are feeling the baby move.  If you have had any abnormal symptoms, such as leaking fluid, bleeding, severe headaches, or abdominal cramping.  If you have any questions. Other tests or screenings that may be performed during your third trimester include:  Blood tests that check for low iron levels (anemia).  Fetal testing to check the health, activity level, and growth of the fetus. Testing is done if you have certain medical conditions or if there are problems during the pregnancy. FALSE LABOR You may feel small, irregular contractions that   eventually go away. These are called Braxton Hicks contractions, or false labor. Contractions may last for hours, days, or even weeks before true labor sets in. If contractions come at regular intervals, intensify, or become painful, it is best to be seen by your caregiver.  SIGNS OF LABOR   Menstrual-like cramps.  Contractions that are 5 minutes apart or less.  Contractions that start on the top of the uterus and spread down to the lower abdomen and back.  A sense of increased pelvic pressure or back  pain.  A watery or bloody mucus discharge that comes from the vagina. If you have any of these signs before the 37th week of pregnancy, call your caregiver right away. You need to go to the hospital to get checked immediately. HOME CARE INSTRUCTIONS   Avoid all smoking, herbs, alcohol, and unprescribed drugs. These chemicals affect the formation and growth of the baby.  Follow your caregiver's instructions regarding medicine use. There are medicines that are either safe or unsafe to take during pregnancy.  Exercise only as directed by your caregiver. Experiencing uterine cramps is a good sign to stop exercising.  Continue to eat regular, healthy meals.  Wear a good support bra for breast tenderness.  Do not use hot tubs, steam rooms, or saunas.  Wear your seat belt at all times when driving.  Avoid raw meat, uncooked cheese, cat litter boxes, and soil used by cats. These carry germs that can cause birth defects in the baby.  Take your prenatal vitamins.  Try taking a stool softener (if your caregiver approves) if you develop constipation. Eat more high-fiber foods, such as fresh vegetables or fruit and whole grains. Drink plenty of fluids to keep your urine clear or pale yellow.  Take warm sitz baths to soothe any pain or discomfort caused by hemorrhoids. Use hemorrhoid cream if your caregiver approves.  If you develop varicose veins, wear support hose. Elevate your feet for 15 minutes, 3-4 times a day. Limit salt in your diet.  Avoid heavy lifting, wear low heal shoes, and practice good posture.  Rest a lot with your legs elevated if you have leg cramps or low back pain.  Visit your dentist if you have not gone during your pregnancy. Use a soft toothbrush to brush your teeth and be gentle when you floss.  A sexual relationship may be continued unless your caregiver directs you otherwise.  Do not travel far distances unless it is absolutely necessary and only with the approval  of your caregiver.  Take prenatal classes to understand, practice, and ask questions about the labor and delivery.  Make a trial run to the hospital.  Pack your hospital bag.  Prepare the baby's nursery.  Continue to go to all your prenatal visits as directed by your caregiver. SEEK MEDICAL CARE IF:  You are unsure if you are in labor or if your water has broken.  You have dizziness.  You have mild pelvic cramps, pelvic pressure, or nagging pain in your abdominal area.  You have persistent nausea, vomiting, or diarrhea.  You have a bad smelling vaginal discharge.  You have pain with urination. SEEK IMMEDIATE MEDICAL CARE IF:   You have a fever.  You are leaking fluid from your vagina.  You have spotting or bleeding from your vagina.  You have severe abdominal cramping or pain.  You have rapid weight loss or gain.  You have shortness of breath with chest pain.  You notice sudden or extreme swelling   of your face, hands, ankles, feet, or legs.  You have not felt your baby move in over an hour.  You have severe headaches that do not go away with medicine.  You have vision changes. Document Released: 11/19/2001 Document Revised: 11/30/2013 Document Reviewed: 01/26/2013 [employer] Patient Information 2015 [employer]. This information is not intended to replace advice given to you by your health care provider. Make sure you discuss any questions you have with your health care provider.  

## 2014-05-30 NOTE — Progress Notes (Signed)
F- 76,79,77,88,109 B-77,87,100,125,135 L-79, 140,113,130 S-120 NST reactive, continue glyburide dose

## 2014-06-02 ENCOUNTER — Ambulatory Visit (INDEPENDENT_AMBULATORY_CARE_PROVIDER_SITE_OTHER): Payer: Medicaid Other | Admitting: *Deleted

## 2014-06-02 VITALS — BP 121/72 | HR 82 | Wt 191.7 lb

## 2014-06-02 DIAGNOSIS — O99810 Abnormal glucose complicating pregnancy: Secondary | ICD-10-CM

## 2014-06-06 ENCOUNTER — Ambulatory Visit (INDEPENDENT_AMBULATORY_CARE_PROVIDER_SITE_OTHER): Payer: Medicaid Other | Admitting: Obstetrics and Gynecology

## 2014-06-06 VITALS — BP 110/77 | HR 93 | Temp 96.9°F | Wt 190.6 lb

## 2014-06-06 DIAGNOSIS — Z3493 Encounter for supervision of normal pregnancy, unspecified, third trimester: Secondary | ICD-10-CM

## 2014-06-06 DIAGNOSIS — F17200 Nicotine dependence, unspecified, uncomplicated: Secondary | ICD-10-CM

## 2014-06-06 DIAGNOSIS — Z3480 Encounter for supervision of other normal pregnancy, unspecified trimester: Secondary | ICD-10-CM

## 2014-06-06 DIAGNOSIS — O24419 Gestational diabetes mellitus in pregnancy, unspecified control: Secondary | ICD-10-CM

## 2014-06-06 DIAGNOSIS — O99810 Abnormal glucose complicating pregnancy: Secondary | ICD-10-CM

## 2014-06-06 LAB — POCT URINALYSIS DIP (DEVICE)
BILIRUBIN URINE: NEGATIVE
GLUCOSE, UA: NEGATIVE mg/dL
HGB URINE DIPSTICK: NEGATIVE
Ketones, ur: NEGATIVE mg/dL
Leukocytes, UA: NEGATIVE
NITRITE: NEGATIVE
Protein, ur: NEGATIVE mg/dL
Specific Gravity, Urine: 1.015 (ref 1.005–1.030)
UROBILINOGEN UA: 0.2 mg/dL (ref 0.0–1.0)
pH: 6.5 (ref 5.0–8.0)

## 2014-06-06 LAB — US OB FOLLOW UP

## 2014-06-06 NOTE — Progress Notes (Signed)
Patient is doing well without complaints. FM/PTL precautions reviewed. CBGs majority within range (1 fasting value of 108 and 1 2hr  pp value of 148). Continue glyburide  NST reviewed and reactive

## 2014-06-06 NOTE — Progress Notes (Signed)
NST/AFI/OB follow up

## 2014-06-09 ENCOUNTER — Ambulatory Visit (INDEPENDENT_AMBULATORY_CARE_PROVIDER_SITE_OTHER): Payer: Medicaid Other | Admitting: *Deleted

## 2014-06-09 VITALS — BP 124/67 | HR 101

## 2014-06-09 DIAGNOSIS — O99810 Abnormal glucose complicating pregnancy: Secondary | ICD-10-CM

## 2014-06-09 DIAGNOSIS — O24419 Gestational diabetes mellitus in pregnancy, unspecified control: Secondary | ICD-10-CM

## 2014-06-13 ENCOUNTER — Ambulatory Visit (INDEPENDENT_AMBULATORY_CARE_PROVIDER_SITE_OTHER): Payer: Medicaid Other | Admitting: Obstetrics & Gynecology

## 2014-06-13 VITALS — BP 102/70 | HR 94 | Temp 97.0°F | Wt 190.9 lb

## 2014-06-13 DIAGNOSIS — O99891 Other specified diseases and conditions complicating pregnancy: Secondary | ICD-10-CM

## 2014-06-13 DIAGNOSIS — O24419 Gestational diabetes mellitus in pregnancy, unspecified control: Secondary | ICD-10-CM

## 2014-06-13 DIAGNOSIS — O9989 Other specified diseases and conditions complicating pregnancy, childbirth and the puerperium: Secondary | ICD-10-CM

## 2014-06-13 DIAGNOSIS — O99810 Abnormal glucose complicating pregnancy: Secondary | ICD-10-CM

## 2014-06-13 DIAGNOSIS — O99613 Diseases of the digestive system complicating pregnancy, third trimester: Secondary | ICD-10-CM

## 2014-06-13 DIAGNOSIS — K5900 Constipation, unspecified: Secondary | ICD-10-CM

## 2014-06-13 DIAGNOSIS — Z3480 Encounter for supervision of other normal pregnancy, unspecified trimester: Secondary | ICD-10-CM

## 2014-06-13 LAB — POCT URINALYSIS DIP (DEVICE)
BILIRUBIN URINE: NEGATIVE
GLUCOSE, UA: NEGATIVE mg/dL
Hgb urine dipstick: NEGATIVE
KETONES UR: NEGATIVE mg/dL
Nitrite: NEGATIVE
Protein, ur: NEGATIVE mg/dL
Specific Gravity, Urine: 1.015 (ref 1.005–1.030)
Urobilinogen, UA: 0.2 mg/dL (ref 0.0–1.0)
pH: 7 (ref 5.0–8.0)

## 2014-06-13 LAB — US OB FOLLOW UP

## 2014-06-13 MED ORDER — DOCUSATE SODIUM 100 MG PO CAPS: 100.0000 mg | ORAL_CAPSULE | Freq: Two times a day (BID) | ORAL | Status: AC

## 2014-06-13 MED ORDER — BISACODYL 5 MG PO TBEC: 5.0000 mg | DELAYED_RELEASE_TABLET | Freq: Every day | ORAL | Status: DC | PRN

## 2014-06-13 NOTE — Patient Instructions (Signed)
Return to clinic for any obstetric concerns or go to [facility] for evaluation  

## 2014-06-13 NOTE — Progress Notes (Signed)
Patient reports constipation, recommended OTC remedies such as Colace and Dulcolax. On BS review, one abnormal fasting value of 109; 2 hr postprandials showed 3 abnormal values 130, 140, 162.  Diet adherence recommended. Continue Glyburide 2.5 mg po bid for now. NST performed today was reviewed and was found to be reactive. Normal AFI at 13.4 cm.  Continue recommended antenatal testing and prenatal care.  No other complaints or concerns.  Fetal movement and labor precautions reviewed.

## 2014-06-14 ENCOUNTER — Inpatient Hospital Stay (HOSPITAL_COMMUNITY)
Admission: AD | Admit: 2014-06-14 | Discharge: 2014-06-14 | Disposition: A | Discharge status: Home / Self Care | Payer: Medicaid Other | Source: Ambulatory Visit | Attending: Obstetrics & Gynecology | Admitting: Obstetrics & Gynecology

## 2014-06-14 ENCOUNTER — Encounter (HOSPITAL_COMMUNITY): Payer: Self-pay | Admitting: *Deleted

## 2014-06-14 DIAGNOSIS — O9989 Other specified diseases and conditions complicating pregnancy, childbirth and the puerperium: Principal | ICD-10-CM

## 2014-06-14 DIAGNOSIS — K5900 Constipation, unspecified: Secondary | ICD-10-CM | POA: Diagnosis present

## 2014-06-14 DIAGNOSIS — O99891 Other specified diseases and conditions complicating pregnancy: Secondary | ICD-10-CM | POA: Insufficient documentation

## 2014-06-14 DIAGNOSIS — Z833 Family history of diabetes mellitus: Secondary | ICD-10-CM | POA: Diagnosis not present

## 2014-06-14 DIAGNOSIS — R109 Unspecified abdominal pain: Secondary | ICD-10-CM | POA: Diagnosis not present

## 2014-06-14 DIAGNOSIS — Z87891 Personal history of nicotine dependence: Secondary | ICD-10-CM | POA: Insufficient documentation

## 2014-06-14 DIAGNOSIS — K5641 Fecal impaction: Secondary | ICD-10-CM

## 2014-06-14 DIAGNOSIS — N139 Obstructive and reflux uropathy, unspecified: Secondary | ICD-10-CM | POA: Insufficient documentation

## 2014-06-14 HISTORY — DX: Constipation, unspecified: K59.00

## 2014-06-14 MED ORDER — BISACODYL 10 MG RE SUPP
10.0000 mg | Freq: Once | RECTAL | Status: AC
  Administered 2014-06-14: 10 mg via RECTAL
  Filled 2014-06-14: qty 1

## 2014-06-14 MED ORDER — MILK AND MOLASSES ENEMA
1.0000 | Freq: Once | RECTAL | Status: DC
  Filled 2014-06-14: qty 250

## 2014-06-14 NOTE — Progress Notes (Addendum)
Still no BM after suppository. Continues to have incontinence of liquid stool. Dr. Jaquita RectorMelancon attempted to remove impaction. Small amount of stool removed. MD states that stool is mobile, does not feel impacted.

## 2014-06-14 NOTE — Progress Notes (Signed)
Dr. Olevia Bowens was given report of bladder scan volume of approx. 320cc. Dr. Olevia Bowens advised that no in and out cath necessary for this amount and to proceed with enema. Patient was positioned on her left side and small amount of liquid from enema bag instilled into rectum. No resistance met with tubing. Liquid immediately started coming back out, patient stating that she could not hold liquid. Patient encouraged to get up to bedside commode anywway and try to have a bowel movement. Patient given fresh linens and disposable pants and panties, wash cloths and wipes to clean up. Patient's husband remains with her.

## 2014-06-14 NOTE — [facility] Note (Signed)
Has been getting constipated,  Tried enema and laxatives, no resutls.  Getting where she can't pee now- feeling lots of pressure.  (pt can not sit still)

## 2014-06-14 NOTE — [facility] Note (Signed)
In restroom, told tech- "can't go", has been constipated and can't pee

## 2014-06-14 NOTE — Discharge Instructions (Signed)
Constipation °Constipation is when a person has fewer than three bowel movements a week, has difficulty having a bowel movement, or has stools that are dry, hard, or larger than normal. As people grow older, constipation is more common. If you try to fix constipation with medicines that make you have a bowel movement (laxatives), the problem may get worse. Long-term laxative use may cause the muscles of the colon to become weak. A low-fiber diet, not taking in enough fluids, and taking certain medicines may make constipation worse.  °CAUSES  °· Certain medicines, such as antidepressants, pain medicine, iron supplements, antacids, and water pills.   °· Certain diseases, such as diabetes, irritable bowel syndrome (IBS), thyroid disease, or depression.   °· Not drinking enough water.   °· Not eating enough fiber-rich foods.   °· Stress or travel.   °· Lack of physical activity or exercise.   °· Ignoring the urge to have a bowel movement.   °· Using laxatives too much.   °SIGNS AND SYMPTOMS  °· Having fewer than three bowel movements a week.   °· Straining to have a bowel movement.   °· Having stools that are hard, dry, or larger than normal.   °· Feeling full or bloated.   °· Pain in the lower abdomen.   °· Not feeling relief after having a bowel movement.   °DIAGNOSIS  °Your health care provider will take a medical history and perform a physical exam. Further testing may be done for severe constipation. Some tests may include: °· A barium enema X-ray to examine your rectum, colon, and, sometimes, your small intestine.   °· A sigmoidoscopy to examine your lower colon.   °· A colonoscopy to examine your entire colon. °TREATMENT  °Treatment will depend on the severity of your constipation and what is causing it. Some dietary treatments include drinking more fluids and eating more fiber-rich foods. Lifestyle treatments may include regular exercise. If these diet and lifestyle recommendations do not help, your health care  provider may recommend taking over-the-counter laxative medicines to help you have bowel movements. Prescription medicines may be prescribed if over-the-counter medicines do not work.  °HOME CARE INSTRUCTIONS  °· Eat foods that have a lot of fiber, such as fruits, vegetables, whole grains, and beans. °· Limit foods high in fat and processed sugars, such as french fries, hamburgers, cookies, candies, and soda.   °· A fiber supplement may be added to your diet if you cannot get enough fiber from foods.   °· Drink enough fluids to keep your urine clear or pale yellow.   °· Exercise regularly or as directed by your health care provider.   °· Go to the restroom when you have the urge to go. Do not hold it.   °· Only take over-the-counter or prescription medicines as directed by your health care provider. Do not take other medicines for constipation without talking to your health care provider first.   °SEEK IMMEDIATE MEDICAL CARE IF:  °· You have bright red blood in your stool.   °· Your constipation lasts for more than 4 days or gets worse.   °· You have abdominal or rectal pain.   °· You have thin, pencil-like stools.   °· You have unexplained weight loss. °MAKE SURE YOU:  °· Understand these instructions. °· Will watch your condition. °· Will get help right away if you are not doing well or get worse. °Document Released: 08/23/2004 Document Revised: 11/30/2013 Document Reviewed: 09/06/2013 °ExitCare® Patient Information ©2015 [employer]. This information is not intended to replace advice given to you by your health care provider. Make sure you discuss any questions   you have with your health care provider.  Constipation Constipation is when a person has fewer than three bowel movements a week, has difficulty having a bowel movement, or has stools that are dry, hard, or larger than normal. As people grow older, constipation is more common. If you try to fix constipation with medicines that make you have a bowel  movement (laxatives), the problem may get worse. Long-term laxative use may cause the muscles of the colon to become weak. A low-fiber diet, not taking in enough fluids, and taking certain medicines may make constipation worse.  CAUSES   Certain medicines, such as antidepressants, pain medicine, iron supplements, antacids, and water pills.   Certain diseases, such as diabetes, irritable bowel syndrome (IBS), thyroid disease, or depression.   Not drinking enough water.   Not eating enough fiber-rich foods.   Stress or travel.   Lack of physical activity or exercise.   Ignoring the urge to have a bowel movement.   Using laxatives too much.  SIGNS AND SYMPTOMS   Having fewer than three bowel movements a week.   Straining to have a bowel movement.   Having stools that are hard, dry, or larger than normal.   Feeling full or bloated.   Pain in the lower abdomen.   Not feeling relief after having a bowel movement.  DIAGNOSIS  Your health care provider will take a medical history and perform a physical exam. Further testing may be done for severe constipation. Some tests may include:  A barium enema X-ray to examine your rectum, colon, and, sometimes, your small intestine.   A sigmoidoscopy to examine your lower colon.   A colonoscopy to examine your entire colon. TREATMENT  Treatment will depend on the severity of your constipation and what is causing it. Some dietary treatments include drinking more fluids and eating more fiber-rich foods. Lifestyle treatments may include regular exercise. If these diet and lifestyle recommendations do not help, your health care provider may recommend taking over-the-counter laxative medicines to help you have bowel movements. Prescription medicines may be prescribed if over-the-counter medicines do not work.  HOME CARE INSTRUCTIONS   Eat foods that have a lot of fiber, such as fruits, vegetables, whole grains, and beans.  Limit  foods high in fat and processed sugars, such as french fries, hamburgers, cookies, candies, and soda.   A fiber supplement may be added to your diet if you cannot get enough fiber from foods.   Drink enough fluids to keep your urine clear or pale yellow.   Exercise regularly or as directed by your health care provider.   Go to the restroom when you have the urge to go. Do not hold it.   Only take over-the-counter or prescription medicines as directed by your health care provider. Do not take other medicines for constipation without talking to your health care provider first.  [place] IF:   You have bright red blood in your stool.   Your constipation lasts for more than 4 days or gets worse.   You have abdominal or rectal pain.   You have thin, pencil-like stools.   You have unexplained weight loss. MAKE SURE YOU:   Understand these instructions.  Will watch your condition.  Will get help right away if you are not doing well or get worse. Document Released: 08/23/2004 Document Revised: 11/30/2013 Document Reviewed: 09/06/2013 [facility] Patient Information 2015 [place], [place]. This information is not intended to replace advice given to you by your  health care provider. Make sure you discuss any questions you have with your health care provider. ° °

## 2014-06-14 NOTE — MAU Provider Note (Signed)
First Provider Initiated Contact with Patient 06/14/14 1313      Chief Complaint:  Constipation   Taylor Ford NailsSegraves is  28 y.o. G2P0010 at 2331w6d presents complaining of Constipation .  She states none contractions are associated with none vaginal bleeding, intact membranes, along with active fetal movement. [facility] at which time she complained of constipation. She was given colace and miralax in order to treat her constipation. Pt. Says that she returned home and took both colace and miralax yesterday evening. She says that she has still not had a BM, and continues to have abdominal pain as well as decreased ability to urinate. The onset of her constipation was Sunday, with progressive difficulty urinating since that time. She feels the urge to urinate, and defecate, however she is unable. She says that she is able to urinate if she sits in warm water. She has previously had BM 2 times / day. She denies fever, chills. She complains of one episode of nausea and vomiting one day ago, however she has since been able to tolerate po. She says that her abdominal pain is crampy and diffuse in nature. She denies hematuria whenever she does urinate, or dysuria. She has had kidney stones previously, and she says that her pain does not feel like this. She has no other complaints.   Obstetrical/Gynecological History: OB History   Grav Para Term Preterm Abortions TAB SAB Ect Mult Living   2 0 0 0 1 0 1 0 0 0     Past Medical History: Past Medical History  Diagnosis Date  . Abnormal Pap smear     abnl 28 yo, all paps normal since  . HPV (human papilloma virus) infection   . Kidney stones     20 12  . Constipation     Past Surgical History: Past Surgical History  Procedure Laterality Date  . Dilation and curettage of uterus      Family History: Family History  Problem Relation Age of Onset  . Diabetes Mother   . Heart disease Paternal Grandfather   . Cancer  Maternal Grandfather   . Hypertension Paternal Grandmother     Social History: History  Substance Use Topics  . Smoking status: Current Every Day Smoker -- 0.25 packs/day    Types: Cigarettes  . Smokeless tobacco: Never Used  . Alcohol Use: No    Allergies: No Known Allergies  Meds:  Facility-administered medications prior to admission  Medication Dose Route Frequency Provider Last Rate Last Dose  . Tdap (BOOSTRIX) injection 0.5 mL  0.5 mL Intramuscular Once Aviva SignsMarie L Williams, CNM       Prescriptions prior to admission  Medication Sig Dispense Refill  . bisacodyl (DULCOLAX) 5 MG EC tablet Take 1-2 tablets (5-10 mg total) by mouth daily as needed for moderate constipation.  30 tablet  2  . buPROPion (WELLBUTRIN SR) 150 MG 12 hr tablet Take 1 tablet (150 mg total) by mouth 2 (two) times daily.  60 tablet  1  . docusate sodium (COLACE) 100 MG capsule Take 1 capsule (100 mg total) by mouth 2 (two) times daily.  30 capsule  2  . glyBURIDE (DIABETA) 2.5 MG tablet Take 1 tablet (2.5 mg total) by mouth 2 (two) times daily with a meal.  60 tablet  11  . Prenatal Vit-Fe Fumarate-FA (PRENATAL MULTIVITAMIN) TABS tablet Take 1 tablet by mouth at bedtime.        Review of Systems -  HPI  per above.    Physical Exam  Blood pressure 125/80, pulse 116, temperature 98.1 F (36.7 C), temperature source Oral, resp. rate 20, height 5\' 1"  (1.549 m), weight 86.456 kg (190 lb 9.6 oz), last menstrual period 10/10/2013, SpO2 100.00%. GENERAL: Well-developed, well-nourished female in no acute distress.  LUNGS: Clear to auscultation bilaterally.  HEART: Tachycardic to 110, regular rhythm. ABDOMEN: Soft, nondistended, gravid. Appropriate for gestational age, mildly tender to deep palpation laterally.  EXTREMITIES: Nontender, no edema, 2+ distal pulses. Neurologically grossly intact.  CERVICAL EXAM: Dilatation 0cm   Effacement 10%   Station High   Presentation: cephalic FHT:  Baseline rate 145 bpm    Variability moderate  Accelerations present   Decelerations none Contractions: None   Labs: No results found for this or any previous visit (from the past 24 hour(s)). Imaging Studies:  No results found.  Assessment: Taylor Ford is  28 y.o. G2P0010 at 2338w6d presents with constipation, abdominal pain, and difficulty urinating.  Plan: 1. Constipation - Disempacted with tap water enema x2 and subsequent manual disempaction.  - Bowel movement x 2 in MAU - Plan to continue home miralax and colace for continued resolution of constipation. - High Fiber Diet and PO fluid intake advised  - Return to the ED if your symptoms worsen, or you are unable to tolerate po intake.   2. Urinary Obstruction - Likely difficulty urinating 2/2 manual compression of urinary outflow tract.  - Bladder scan with 340cc. Able to urinate after disempaction. Advised patient to continue taking colace / miralax as with improvement of constipation she will likely be able to urinate more easily.   3. Return for worsening of symptoms, or for any other acute concerns.   4. FWB- cat I tracing  Thanks for letting us take care of you!    [place], Hillery HunterCaleb G 7/7/20151:33 PM  I have seen and examined this patient and agree with above documentation in the resident's note. Pt presented with fecal impaction and acute urinary retention. All improved after disempaction in the MAU.  Cont bowel regimen as above. Initial tachycardia also resolved with resoluition of pain.  FWB- cat I tracing.   Rulon AbideKeli Biagio Snelson, M.D. [facility] Fellow 06/14/2014 7:19 PM

## 2014-06-15 NOTE — MAU Provider Note (Signed)
Attestation of Attending Supervision of Fellow: Evaluation and management procedures were performed by the Fellow under my supervision and collaboration.  I have reviewed the Fellow's note and chart, and I agree with the management and plan.    

## 2014-06-16 ENCOUNTER — Ambulatory Visit (INDEPENDENT_AMBULATORY_CARE_PROVIDER_SITE_OTHER): Payer: Medicaid Other | Admitting: *Deleted

## 2014-06-16 VITALS — BP 116/68 | HR 97

## 2014-06-16 DIAGNOSIS — O24419 Gestational diabetes mellitus in pregnancy, unspecified control: Secondary | ICD-10-CM

## 2014-06-16 DIAGNOSIS — O99810 Abnormal glucose complicating pregnancy: Secondary | ICD-10-CM

## 2014-06-20 ENCOUNTER — Ambulatory Visit (INDEPENDENT_AMBULATORY_CARE_PROVIDER_SITE_OTHER): Payer: Medicaid Other | Admitting: Family Medicine

## 2014-06-20 VITALS — BP 119/83 | HR 93 | Wt 193.5 lb

## 2014-06-20 DIAGNOSIS — O99810 Abnormal glucose complicating pregnancy: Secondary | ICD-10-CM

## 2014-06-20 DIAGNOSIS — O24419 Gestational diabetes mellitus in pregnancy, unspecified control: Secondary | ICD-10-CM

## 2014-06-20 DIAGNOSIS — O0993 Supervision of high risk pregnancy, unspecified, third trimester: Secondary | ICD-10-CM

## 2014-06-20 DIAGNOSIS — F17200 Nicotine dependence, unspecified, uncomplicated: Secondary | ICD-10-CM

## 2014-06-20 DIAGNOSIS — O0990 Supervision of high risk pregnancy, unspecified, unspecified trimester: Secondary | ICD-10-CM

## 2014-06-20 LAB — US OB FOLLOW UP

## 2014-06-20 LAB — POCT URINALYSIS DIP (DEVICE)
Bilirubin Urine: NEGATIVE
Glucose, UA: NEGATIVE mg/dL
Hgb urine dipstick: NEGATIVE
Ketones, ur: NEGATIVE mg/dL
Nitrite: NEGATIVE
Protein, ur: NEGATIVE mg/dL
Specific Gravity, Urine: 1.01 (ref 1.005–1.030)
Urobilinogen, UA: 0.2 mg/dL (ref 0.0–1.0)
pH: 7 (ref 5.0–8.0)

## 2014-06-20 NOTE — Patient Instructions (Signed)
Third Trimester of Pregnancy The third trimester is from week 29 through week 42, months 7 through 9. The third trimester is a time when the fetus is growing rapidly. At the end of the ninth month, the fetus is about 20 inches in length and weighs 6-10 pounds.  BODY CHANGES Your body goes through many changes during pregnancy. The changes vary from woman to woman.   Your weight will continue to increase. You can expect to gain 25-35 pounds (11-16 kg) by the end of the pregnancy.  You may begin to get stretch marks on your hips, abdomen, and breasts.  You may urinate more often because the fetus is moving lower into your pelvis and pressing on your bladder.  You may develop or continue to have heartburn as a result of your pregnancy.  You may develop constipation because certain hormones are causing the muscles that push waste through your intestines to slow down.  You may develop hemorrhoids or swollen, bulging veins (varicose veins).  You may have pelvic pain because of the weight gain and pregnancy hormones relaxing your joints between the bones in your pelvis. Backaches may result from overexertion of the muscles supporting your posture.  You may have changes in your hair. These can include thickening of your hair, rapid growth, and changes in texture. Some women also have hair loss during or after pregnancy, or hair that feels dry or thin. Your hair will most likely return to normal after your baby is born.  Your breasts will continue to grow and be tender. A yellow discharge may leak from your breasts called colostrum.  Your belly button may stick out.  You may feel short of breath because of your expanding uterus.  You may notice the fetus "dropping," or moving lower in your abdomen.  You may have a bloody mucus discharge. This usually occurs a few days to a week before labor begins.  Your cervix becomes thin and soft (effaced) near your due date. WHAT TO EXPECT AT YOUR PRENATAL  EXAMS  You will have prenatal exams every 2 weeks until week 36. Then, you will have weekly prenatal exams. During a routine prenatal visit:  You will be weighed to make sure you and the fetus are growing normally.  Your blood pressure is taken.  Your abdomen will be measured to track your baby's growth.  The fetal heartbeat will be listened to.  Any test results from the previous visit will be discussed.  You may have a cervical check near your due date to see if you have effaced. At around 36 weeks, your caregiver will check your cervix. At the same time, your caregiver will also perform a test on the secretions of the vaginal tissue. This test is to determine if a type of bacteria, Group B streptococcus, is present. Your caregiver will explain this further. Your caregiver may ask you:  What your birth plan is.  How you are feeling.  If you are feeling the baby move.  If you have had any abnormal symptoms, such as leaking fluid, bleeding, severe headaches, or abdominal cramping.  If you have any questions. Other tests or screenings that may be performed during your third trimester include:  Blood tests that check for low iron levels (anemia).  Fetal testing to check the health, activity level, and growth of the fetus. Testing is done if you have certain medical conditions or if there are problems during the pregnancy. FALSE LABOR You may feel small, irregular contractions that   eventually go away. These are called Braxton Hicks contractions, or false labor. Contractions may last for hours, days, or even weeks before true labor sets in. If contractions come at regular intervals, intensify, or become painful, it is best to be seen by your caregiver.  SIGNS OF LABOR   Menstrual-like cramps.  Contractions that are 5 minutes apart or less.  Contractions that start on the top of the uterus and spread down to the lower abdomen and back.  A sense of increased pelvic pressure or back  pain.  A watery or bloody mucus discharge that comes from the vagina. If you have any of these signs before the 37th week of pregnancy, call your caregiver right away. You need to go to the hospital to get checked immediately. HOME CARE INSTRUCTIONS   Avoid all smoking, herbs, alcohol, and unprescribed drugs. These chemicals affect the formation and growth of the baby.  Follow your caregiver's instructions regarding medicine use. There are medicines that are either safe or unsafe to take during pregnancy.  Exercise only as directed by your caregiver. Experiencing uterine cramps is a good sign to stop exercising.  Continue to eat regular, healthy meals.  Wear a good support bra for breast tenderness.  Do not use hot tubs, steam rooms, or saunas.  Wear your seat belt at all times when driving.  Avoid raw meat, uncooked cheese, cat litter boxes, and soil used by cats. These carry germs that can cause birth defects in the baby.  Take your prenatal vitamins.  Try taking a stool softener (if your caregiver approves) if you develop constipation. Eat more high-fiber foods, such as fresh vegetables or fruit and whole grains. Drink plenty of fluids to keep your urine clear or pale yellow.  Take warm sitz baths to soothe any pain or discomfort caused by hemorrhoids. Use hemorrhoid cream if your caregiver approves.  If you develop varicose veins, wear support hose. Elevate your feet for 15 minutes, 3-4 times a day. Limit salt in your diet.  Avoid heavy lifting, wear low heal shoes, and practice good posture.  Rest a lot with your legs elevated if you have leg cramps or low back pain.  Visit your dentist if you have not gone during your pregnancy. Use a soft toothbrush to brush your teeth and be gentle when you floss.  A sexual relationship may be continued unless your caregiver directs you otherwise.  Do not travel far distances unless it is absolutely necessary and only with the approval  of your caregiver.  Take prenatal classes to understand, practice, and ask questions about the labor and delivery.  Make a trial run to the hospital.  Pack your hospital bag.  Prepare the baby's nursery.  Continue to go to all your prenatal visits as directed by your caregiver. SEEK MEDICAL CARE IF:  You are unsure if you are in labor or if your water has broken.  You have dizziness.  You have mild pelvic cramps, pelvic pressure, or nagging pain in your abdominal area.  You have persistent nausea, vomiting, or diarrhea.  You have a bad smelling vaginal discharge.  You have pain with urination. SEEK IMMEDIATE MEDICAL CARE IF:   You have a fever.  You are leaking fluid from your vagina.  You have spotting or bleeding from your vagina.  You have severe abdominal cramping or pain.  You have rapid weight loss or gain.  You have shortness of breath with chest pain.  You notice sudden or extreme swelling   of your face, hands, ankles, feet, or legs.  You have not felt your baby move in over an hour.  You have severe headaches that do not go away with medicine.  You have vision changes. Document Released: 11/19/2001 Document Revised: 11/30/2013 Document Reviewed: 01/26/2013 [employer] Patient Information 2015 [employer]. This information is not intended to replace advice given to you by your health care provider. Make sure you discuss any questions you have with your health care provider.  

## 2014-06-20 NOTE — Progress Notes (Signed)
S: 28 yo G2P0010 @ 6931w5d here for ROBV and NST - doing well - no concerns. Seen in MAU for constipation. Now much better.   - no ctx, lof, vb.+FM DM- fasting: 71-84. 2 hour: 68-122. Only 1 greater than 120 but 3 right at 120  O: see flowsheet  A/P NST reviewed and reactive. Cat I tracing.  DM- cont glyburide BID Schedule f/u US for 38 weeks - cont twice weekly testing

## 2014-06-23 ENCOUNTER — Ambulatory Visit (INDEPENDENT_AMBULATORY_CARE_PROVIDER_SITE_OTHER): Payer: Medicaid Other | Admitting: *Deleted

## 2014-06-23 VITALS — BP 117/72 | HR 95

## 2014-06-23 DIAGNOSIS — O99810 Abnormal glucose complicating pregnancy: Secondary | ICD-10-CM

## 2014-06-23 DIAGNOSIS — O24419 Gestational diabetes mellitus in pregnancy, unspecified control: Secondary | ICD-10-CM

## 2014-06-26 NOTE — Progress Notes (Signed)
NST 06/02/14 reactive 

## 2014-06-27 ENCOUNTER — Ambulatory Visit (INDEPENDENT_AMBULATORY_CARE_PROVIDER_SITE_OTHER): Payer: Medicaid Other | Admitting: Obstetrics & Gynecology

## 2014-06-27 ENCOUNTER — Ambulatory Visit (HOSPITAL_COMMUNITY)
Admission: RE | Admit: 2014-06-27 | Discharge: 2014-06-27 | Disposition: A | Discharge status: Home / Self Care | Payer: Medicaid Other | Source: Ambulatory Visit | Attending: Obstetrics & Gynecology | Admitting: Obstetrics & Gynecology

## 2014-06-27 VITALS — BP 117/62 | HR 101 | Wt 193.0 lb

## 2014-06-27 DIAGNOSIS — Z3689 Encounter for other specified antenatal screening: Secondary | ICD-10-CM | POA: Diagnosis not present

## 2014-06-27 DIAGNOSIS — O24419 Gestational diabetes mellitus in pregnancy, unspecified control: Secondary | ICD-10-CM

## 2014-06-27 DIAGNOSIS — O99810 Abnormal glucose complicating pregnancy: Secondary | ICD-10-CM | POA: Insufficient documentation

## 2014-06-27 LAB — POCT URINALYSIS DIP (DEVICE)
Bilirubin Urine: NEGATIVE
GLUCOSE, UA: NEGATIVE mg/dL
HGB URINE DIPSTICK: NEGATIVE
Ketones, ur: NEGATIVE mg/dL
Nitrite: NEGATIVE
Protein, ur: NEGATIVE mg/dL
Specific Gravity, Urine: 1.015 (ref 1.005–1.030)
UROBILINOGEN UA: 0.2 mg/dL (ref 0.0–1.0)
pH: 6.5 (ref 5.0–8.0)

## 2014-06-27 LAB — OB RESULTS CONSOLE GC/CHLAMYDIA
Chlamydia: NEGATIVE
GC PROBE AMP, GENITAL: NEGATIVE

## 2014-06-27 LAB — OB RESULTS CONSOLE GBS: GBS: NEGATIVE

## 2014-06-27 NOTE — Progress Notes (Signed)
Blood sugars mostly within range, recommended diet adherence; continue Glyburide 2.5 mg bid for now. Pelvic cultures done today NST performed today was reviewed and was found to be reactive.  Continue recommended antenatal testing and prenatal care. No other complaints or concerns.  Fetal movement and labor precautions reviewed.

## 2014-06-27 NOTE — Progress Notes (Signed)
Cultures today, NST

## 2014-06-27 NOTE — Patient Instructions (Signed)
Return to clinic for any obstetric concerns or go to [facility] for evaluation  

## 2014-06-28 LAB — GC/CHLAMYDIA PROBE AMP
CT Probe RNA: NEGATIVE
GC Probe RNA: NEGATIVE

## 2014-06-29 ENCOUNTER — Encounter: Payer: Self-pay | Admitting: Obstetrics & Gynecology

## 2014-06-29 LAB — CULTURE, BETA STREP (GROUP B ONLY)

## 2014-06-30 ENCOUNTER — Inpatient Hospital Stay (HOSPITAL_COMMUNITY)
Admission: AD | Admit: 2014-06-30 | Discharge: 2014-06-30 | Disposition: A | Discharge status: Home / Self Care | Payer: Medicaid Other | Source: Ambulatory Visit | Attending: Obstetrics & Gynecology | Admitting: Obstetrics & Gynecology

## 2014-06-30 ENCOUNTER — Ambulatory Visit (HOSPITAL_COMMUNITY)
Admission: RE | Admit: 2014-06-30 | Discharge: 2014-06-30 | Disposition: A | Discharge status: Home / Self Care | Payer: Medicaid Other | Source: Ambulatory Visit | Attending: Obstetrics & Gynecology | Admitting: Obstetrics & Gynecology

## 2014-06-30 ENCOUNTER — Ambulatory Visit (INDEPENDENT_AMBULATORY_CARE_PROVIDER_SITE_OTHER): Payer: Medicaid Other | Admitting: General Practice

## 2014-06-30 ENCOUNTER — Encounter (HOSPITAL_COMMUNITY): Payer: Self-pay | Admitting: *Deleted

## 2014-06-30 VITALS — Wt 193.0 lb

## 2014-06-30 DIAGNOSIS — O368390 Maternal care for abnormalities of the fetal heart rate or rhythm, unspecified trimester, not applicable or unspecified: Secondary | ICD-10-CM

## 2014-06-30 DIAGNOSIS — Z3689 Encounter for other specified antenatal screening: Secondary | ICD-10-CM | POA: Insufficient documentation

## 2014-06-30 DIAGNOSIS — O289 Unspecified abnormal findings on antenatal screening of mother: Secondary | ICD-10-CM

## 2014-06-30 DIAGNOSIS — O288 Other abnormal findings on antenatal screening of mother: Secondary | ICD-10-CM

## 2014-06-30 DIAGNOSIS — O99330 Smoking (tobacco) complicating pregnancy, unspecified trimester: Secondary | ICD-10-CM | POA: Diagnosis not present

## 2014-06-30 DIAGNOSIS — B977 Papillomavirus as the cause of diseases classified elsewhere: Secondary | ICD-10-CM | POA: Diagnosis not present

## 2014-06-30 DIAGNOSIS — O98519 Other viral diseases complicating pregnancy, unspecified trimester: Secondary | ICD-10-CM | POA: Insufficient documentation

## 2014-06-30 DIAGNOSIS — O99810 Abnormal glucose complicating pregnancy: Secondary | ICD-10-CM

## 2014-06-30 DIAGNOSIS — O24419 Gestational diabetes mellitus in pregnancy, unspecified control: Secondary | ICD-10-CM

## 2014-06-30 NOTE — Discharge Instructions (Signed)

## 2014-06-30 NOTE — MAU Provider Note (Signed)
Chief Complaint:  No chief complaint on file.  First Provider Initiated Contact with Patient 06/30/2014 at 1635.  HPI: Taylor Ford is a 28 y.o. G2P0010 at [redacted]w[redacted]d who was sent to maternity admissions from clinic for NRNST follow by [facility] 6/8. Needs prolonged monitoring.  Denies contractions, leakage of fluid or vaginal bleeding. Good fetal movement.   Pregnancy Course: A2 GDM   Past Medical History: Past Medical History  Diagnosis Date  . Abnormal Pap smear     abnl 28 yo, all paps normal since  . HPV (human papilloma virus) infection   . Kidney stones     2012  . Constipation     Past obstetric history: OB History  Gravida Para Term Preterm AB SAB TAB Ectopic Multiple Living  2 0 0 0 1 1 0 0 0 0     # Outcome Date GA Lbr Len/2nd Weight Sex Delivery Anes PTL Lv  2 CUR           1 SAB 2007              Past Surgical History: Past Surgical History  Procedure Laterality Date  . Dilation and curettage of uterus       Family History: Family History  Problem Relation Age of Onset  . Diabetes Mother   . Heart disease Paternal Grandfather   . Cancer Maternal Grandfather   . Hypertension Paternal Grandmother     Social History: History  Substance Use Topics  . Smoking status: Current Every Day Smoker -- 0.25 packs/day    Types: Cigarettes  . Smokeless tobacco: Never Used  . Alcohol Use: No    Allergies: No Known Allergies  Meds:  Prescriptions prior to admission  Medication Sig Dispense Refill  . buPROPion (WELLBUTRIN SR) 150 MG 12 hr tablet Take 1 tablet (150 mg total) by mouth 2 (two) times daily.  60 tablet  1  . docusate sodium (COLACE) 100 MG capsule Take 1 capsule (100 mg total) by mouth 2 (two) times daily.  30 capsule  2  . glyBURIDE (DIABETA) 2.5 MG tablet Take 1 tablet (2.5 mg total) by mouth 2 (two) times daily with a meal.  60 tablet  11  . Prenatal Vit-Fe Fumarate-FA (PRENATAL MULTIVITAMIN) TABS tablet Take 1 tablet by mouth at bedtime.         ROS: Pertinent findings in history of present illness.  Physical Exam  Blood pressure 131/83, pulse 84, temperature 98.3 F (36.8 C), resp. rate 16, last menstrual period 10/10/2013. GENERAL: Well-developed, well-nourished female in no acute distress.  HEENT: normocephalic HEART: normal rate RESP: normal effort ABDOMEN: Soft, non-tender, gravid appropriate for gestational age EXTREMITIES: Nontender, no edema NEURO: alert and oriented SPECULUM EXAM: Deferred   FHT:  Baseline 130 , moderate variability, accelerations present, no decelerations Contractions: none   Labs: No results found for this or any previous visit (from the past 24 hour(s)).  Imaging:  BPP 6/8 (2 off for breathing mvmts)  MAU Course: Reactive tracing x 1 hour.   Assessment: 1. NST (non-stress test) nonreactive   2. Gestational diabetes mellitus, class A2    Plan: Discharge home Labor precautions and fetal kick counts Follow-up Information   Follow up with [facility] On 07/04/2014. (As scheduled)    Specialty:  Obstetrics and Gynecology   Contact information:   [address] [place] [address] [phone]      Follow up with [facility]. (As needed in  emergencies)    Contact information:   [address] [id number] [place] [id number] [phone]       Medication List         buPROPion 150 MG 12 hr tablet  Commonly known as:  WELLBUTRIN SR  Take 1 tablet (150 mg total) by mouth 2 (two) times daily.     docusate sodium 100 MG capsule  Commonly known as:  COLACE  Take 1 capsule (100 mg total) by mouth 2 (two) times daily.     glyBURIDE 2.5 MG tablet  Commonly known as:  DIABETA  Take 1 tablet (2.5 mg total) by mouth 2 (two) times daily with a meal.     prenatal multivitamin Tabs tablet  Take 1 tablet by mouth at bedtime.        [place] Elis Sauber, CNM 06/30/2014 4:40 PM

## 2014-06-30 NOTE — MAU Note (Signed)
Pt was sent over from clinic for further monitoring

## 2014-06-30 NOTE — Progress Notes (Signed)
Nonreactive NST today, sent for BPP BPP 6/8 (-breathing) Will send to MAU for repeat NST, possible prolonged monitoring.

## 2014-06-30 NOTE — MAU Provider Note (Signed)

## 2014-07-04 ENCOUNTER — Ambulatory Visit (INDEPENDENT_AMBULATORY_CARE_PROVIDER_SITE_OTHER): Payer: Medicaid Other | Admitting: Obstetrics & Gynecology

## 2014-07-04 VITALS — BP 128/80 | HR 103 | Temp 98.5°F | Wt 194.3 lb

## 2014-07-04 DIAGNOSIS — O0993 Supervision of high risk pregnancy, unspecified, third trimester: Secondary | ICD-10-CM

## 2014-07-04 DIAGNOSIS — O0990 Supervision of high risk pregnancy, unspecified, unspecified trimester: Secondary | ICD-10-CM

## 2014-07-04 DIAGNOSIS — O99810 Abnormal glucose complicating pregnancy: Secondary | ICD-10-CM

## 2014-07-04 DIAGNOSIS — O24419 Gestational diabetes mellitus in pregnancy, unspecified control: Secondary | ICD-10-CM

## 2014-07-04 LAB — POCT URINALYSIS DIP (DEVICE)
BILIRUBIN URINE: NEGATIVE
Glucose, UA: 500 mg/dL — AB
Hgb urine dipstick: NEGATIVE
Ketones, ur: NEGATIVE mg/dL
NITRITE: NEGATIVE
PH: 6.5 (ref 5.0–8.0)
Protein, ur: 30 mg/dL — AB
Specific Gravity, Urine: 1.025 (ref 1.005–1.030)
UROBILINOGEN UA: 0.2 mg/dL (ref 0.0–1.0)

## 2014-07-04 NOTE — Progress Notes (Signed)
Patient reports upper abdominal pain/pressure

## 2014-07-04 NOTE — Progress Notes (Signed)
US for growth scheduled on 7/30.  IOL scheduled 8/5 @ 1930

## 2014-07-04 NOTE — Progress Notes (Signed)
NST performed today was reviewed and was found to be reactive.  Continue recommended antenatal testing and prenatal care. Two abnormal fasting values in 100s, after eating at 3 am. Told to watch what she eats at night. Rest of values are within range. No other complaints or concerns.  Fetal movement and labor precautions reviewed.

## 2014-07-04 NOTE — Patient Instructions (Signed)
Return to clinic for any obstetric concerns or go to [facility] for evaluation  

## 2014-07-04 NOTE — Progress Notes (Signed)
NST 06/09/14 reactive 

## 2014-07-06 ENCOUNTER — Telehealth (HOSPITAL_COMMUNITY): Payer: Self-pay | Admitting: *Deleted

## 2014-07-06 NOTE — Progress Notes (Signed)
7/16 NST reviewed and reactive 

## 2014-07-06 NOTE — Telephone Encounter (Signed)
Preadmission screen  

## 2014-07-07 ENCOUNTER — Ambulatory Visit (INDEPENDENT_AMBULATORY_CARE_PROVIDER_SITE_OTHER): Payer: Medicaid Other | Admitting: *Deleted

## 2014-07-07 ENCOUNTER — Ambulatory Visit (HOSPITAL_COMMUNITY): Payer: Medicaid Other

## 2014-07-07 ENCOUNTER — Ambulatory Visit (HOSPITAL_COMMUNITY)
Admission: RE | Admit: 2014-07-07 | Discharge: 2014-07-07 | Disposition: A | Discharge status: Home / Self Care | Payer: Medicaid Other | Source: Ambulatory Visit | Attending: Family Medicine | Admitting: Family Medicine

## 2014-07-07 VITALS — BP 129/82 | HR 101

## 2014-07-07 DIAGNOSIS — O99810 Abnormal glucose complicating pregnancy: Secondary | ICD-10-CM | POA: Insufficient documentation

## 2014-07-07 DIAGNOSIS — O0993 Supervision of high risk pregnancy, unspecified, third trimester: Secondary | ICD-10-CM

## 2014-07-07 DIAGNOSIS — O99330 Smoking (tobacco) complicating pregnancy, unspecified trimester: Secondary | ICD-10-CM | POA: Diagnosis not present

## 2014-07-07 DIAGNOSIS — O09899 Supervision of other high risk pregnancies, unspecified trimester: Secondary | ICD-10-CM | POA: Diagnosis not present

## 2014-07-07 DIAGNOSIS — O24419 Gestational diabetes mellitus in pregnancy, unspecified control: Secondary | ICD-10-CM

## 2014-07-07 DIAGNOSIS — F17200 Nicotine dependence, unspecified, uncomplicated: Secondary | ICD-10-CM

## 2014-07-07 NOTE — Progress Notes (Signed)
US for growth done today 

## 2014-07-10 NOTE — Progress Notes (Signed)
7/30 NST reviewed and reactive 

## 2014-07-11 ENCOUNTER — Ambulatory Visit (INDEPENDENT_AMBULATORY_CARE_PROVIDER_SITE_OTHER): Payer: Medicaid Other | Admitting: Obstetrics & Gynecology

## 2014-07-11 VITALS — BP 125/85 | HR 96 | Wt 194.3 lb

## 2014-07-11 DIAGNOSIS — O99810 Abnormal glucose complicating pregnancy: Secondary | ICD-10-CM

## 2014-07-11 DIAGNOSIS — O0990 Supervision of high risk pregnancy, unspecified, unspecified trimester: Secondary | ICD-10-CM

## 2014-07-11 DIAGNOSIS — O24419 Gestational diabetes mellitus in pregnancy, unspecified control: Secondary | ICD-10-CM

## 2014-07-11 DIAGNOSIS — O0993 Supervision of high risk pregnancy, unspecified, third trimester: Secondary | ICD-10-CM

## 2014-07-11 LAB — POCT URINALYSIS DIP (DEVICE)
Bilirubin Urine: NEGATIVE
Glucose, UA: NEGATIVE mg/dL
Hgb urine dipstick: NEGATIVE
Ketones, ur: NEGATIVE mg/dL
NITRITE: NEGATIVE
PH: 7 (ref 5.0–8.0)
PROTEIN: NEGATIVE mg/dL
Specific Gravity, Urine: 1.015 (ref 1.005–1.030)
UROBILINOGEN UA: 0.2 mg/dL (ref 0.0–1.0)

## 2014-07-11 NOTE — Progress Notes (Signed)
BG all in range, doing well, NST reactive, IOL in 2 days

## 2014-07-11 NOTE — Patient Instructions (Signed)
Labor Induction  Labor induction is when steps are taken to cause a pregnant woman to begin the labor process. Most women go into labor on their own between 37 weeks and 42 weeks of the pregnancy. When this does not happen or when there is a medical need, methods may be used to induce labor. Labor induction causes a pregnant woman's uterus to contract. It also causes the cervix to soften (ripen), open (dilate), and thin out (efface). Usually, labor is not induced before 39 weeks of the pregnancy unless there is a problem with the baby or mother.  Before inducing labor, your health care provider will consider a number of factors, including the following:  The medical condition of you and the baby.   How many weeks along you are.   The status of the baby's lung maturity.   The condition of the cervix.   The position of the baby.  WHAT ARE THE REASONS FOR LABOR INDUCTION? Labor may be induced for the following reasons:  The health of the baby or mother is at risk.   The pregnancy is overdue by 1 week or more.   The water breaks but labor does not start on its own.   The mother has a health condition or serious illness, such as high blood pressure, infection, placental abruption, or diabetes.  The amniotic fluid amounts are low around the baby.   The baby is distressed.  Convenience or wanting the baby to be born on a certain date is not a reason for inducing labor. WHAT METHODS ARE USED FOR LABOR INDUCTION? Several methods of labor induction may be used, such as:   Prostaglandin medicine. This medicine causes the cervix to dilate and ripen. The medicine will also start contractions. It can be taken by mouth or by inserting a suppository into the vagina.   Inserting a thin tube (catheter) with a balloon on the end into the vagina to dilate the cervix. Once inserted, the balloon is expanded with water, which causes the cervix to open.   Stripping the membranes. Your health  care provider separates amniotic sac tissue from the cervix, causing the cervix to be stretched and causing the release of a hormone called progesterone. This may cause the uterus to contract. It is often done during an office visit. You will be sent home to wait for the contractions to begin. You will then come in for an induction.   Breaking the water. Your health care provider makes a hole in the amniotic sac using a small instrument. Once the amniotic sac breaks, contractions should begin. This may still take hours to see an effect.   Medicine to trigger or strengthen contractions. This medicine is given through an IV access tube inserted into a vein in your arm.  All of the methods of induction, besides stripping the membranes, will be done in the hospital. Induction is done in the hospital so that you and the baby can be carefully monitored.  HOW LONG DOES IT TAKE FOR LABOR TO BE INDUCED? Some inductions can take up to 2-3 days. Depending on the cervix, it usually takes less time. It takes longer when you are induced early in the pregnancy or if this is your first pregnancy. If a mother is still pregnant and the induction has been going on for 2-3 days, either the mother will be sent home or a cesarean delivery will be needed. WHAT ARE THE RISKS ASSOCIATED WITH LABOR INDUCTION? Some of the risks of induction   include:   Changes in fetal heart rate, such as too high, too low, or erratic.   Fetal distress.   Chance of infection for the mother and baby.   Increased chance of having a cesarean delivery.   Breaking off (abruption) of the placenta from the uterus (rare).   Uterine rupture (very rare).  When induction is needed for medical reasons, the benefits of induction may outweigh the risks. WHAT ARE SOME REASONS FOR NOT INDUCING LABOR? Labor induction should not be done if:   It is shown that your baby does not tolerate labor.   You have had previous surgeries on your  uterus, such as a myomectomy or the removal of fibroids.   Your placenta lies very low in the uterus and blocks the opening of the cervix (placenta previa).   Your baby is not in a head-down position.   The umbilical cord drops down into the birth canal in front of the baby. This could cut off the baby's blood and oxygen supply.   You have had a previous cesarean delivery.   There are unusual circumstances, such as the baby being extremely premature.  Document Released: 04/16/2007 Document Revised: 07/28/2013 Document Reviewed: 06/24/2013 [employer] Patient Information 2015 [employer]. This information is not intended to replace advice given to you by your health care provider. Make sure you discuss any questions you have with your health care provider.  

## 2014-07-11 NOTE — Progress Notes (Signed)
US for growth done 7/30.  IOL scheduled 8/5 @ 1930

## 2014-07-12 ENCOUNTER — Other Ambulatory Visit: Payer: Medicaid Other

## 2014-07-13 ENCOUNTER — Inpatient Hospital Stay (HOSPITAL_COMMUNITY)
Admission: RE | Admit: 2014-07-13 | Discharge: 2014-07-16 | DRG: 774 | Disposition: A | Discharge status: Home / Self Care | Payer: Medicaid Other | Source: Ambulatory Visit | Attending: Obstetrics & Gynecology | Admitting: Obstetrics & Gynecology

## 2014-07-13 ENCOUNTER — Encounter (HOSPITAL_COMMUNITY): Payer: Self-pay

## 2014-07-13 VITALS — BP 125/76 | HR 93 | Temp 97.9°F | Resp 18 | Ht 62.0 in | Wt 195.0 lb

## 2014-07-13 DIAGNOSIS — Z87442 Personal history of urinary calculi: Secondary | ICD-10-CM | POA: Diagnosis not present

## 2014-07-13 DIAGNOSIS — O99810 Abnormal glucose complicating pregnancy: Secondary | ICD-10-CM | POA: Diagnosis present

## 2014-07-13 DIAGNOSIS — B977 Papillomavirus as the cause of diseases classified elsewhere: Secondary | ICD-10-CM | POA: Diagnosis present

## 2014-07-13 DIAGNOSIS — O23591 Infection of other part of genital tract in pregnancy, first trimester: Secondary | ICD-10-CM

## 2014-07-13 DIAGNOSIS — O99334 Smoking (tobacco) complicating childbirth: Secondary | ICD-10-CM | POA: Diagnosis present

## 2014-07-13 DIAGNOSIS — A5901 Trichomonal vulvovaginitis: Secondary | ICD-10-CM

## 2014-07-13 DIAGNOSIS — O0993 Supervision of high risk pregnancy, unspecified, third trimester: Secondary | ICD-10-CM

## 2014-07-13 DIAGNOSIS — O24419 Gestational diabetes mellitus in pregnancy, unspecified control: Secondary | ICD-10-CM

## 2014-07-13 DIAGNOSIS — O99814 Abnormal glucose complicating childbirth: Secondary | ICD-10-CM | POA: Diagnosis present

## 2014-07-13 DIAGNOSIS — O98519 Other viral diseases complicating pregnancy, unspecified trimester: Secondary | ICD-10-CM | POA: Diagnosis present

## 2014-07-13 DIAGNOSIS — Z833 Family history of diabetes mellitus: Secondary | ICD-10-CM | POA: Diagnosis not present

## 2014-07-13 LAB — CBC
HCT: 35.1 % — ABNORMAL LOW (ref 36.0–46.0)
Hemoglobin: 11.4 g/dL — ABNORMAL LOW (ref 12.0–15.0)
MCH: 30.7 pg (ref 26.0–34.0)
MCHC: 32.5 g/dL (ref 30.0–36.0)
MCV: 94.6 fL (ref 78.0–100.0)
Platelets: 464 10*3/uL — ABNORMAL HIGH (ref 150–400)
RBC: 3.71 MIL/uL — ABNORMAL LOW (ref 3.87–5.11)
RDW: 15.4 % (ref 11.5–15.5)
WBC: 14.4 10*3/uL — ABNORMAL HIGH (ref 4.0–10.5)

## 2014-07-13 LAB — TYPE AND SCREEN
ABO/RH(D): A POS
Antibody Screen: NEGATIVE

## 2014-07-13 LAB — GLUCOSE, RANDOM: GLUCOSE: 115 mg/dL — AB (ref 70–99)

## 2014-07-13 MED ORDER — DIPHENHYDRAMINE HCL 50 MG/ML IJ SOLN: 12.5000 mg | INTRAMUSCULAR | Status: DC | PRN

## 2014-07-13 MED ORDER — OXYTOCIN BOLUS FROM INFUSION: 500.0000 mL | INTRAVENOUS | Status: DC

## 2014-07-13 MED ORDER — MISOPROSTOL 25 MCG QUARTER TABLET
25.0000 ug | ORAL_TABLET | Freq: Once | ORAL | Status: AC
  Administered 2014-07-13: 25 ug via VAGINAL
  Filled 2014-07-13: qty 0.25

## 2014-07-13 MED ORDER — CITRIC ACID-SODIUM CITRATE 334-500 MG/5ML PO SOLN: 30.0000 mL | ORAL | Status: DC | PRN

## 2014-07-13 MED ORDER — PHENYLEPHRINE 40 MCG/ML (10ML) SYRINGE FOR IV PUSH (FOR BLOOD PRESSURE SUPPORT)
80.0000 ug | PREFILLED_SYRINGE | INTRAVENOUS | Status: DC | PRN
  Filled 2014-07-13: qty 2

## 2014-07-13 MED ORDER — LACTATED RINGERS IV SOLN
INTRAVENOUS | Status: DC
  Administered 2014-07-13: 22:00:00 via INTRAVENOUS

## 2014-07-13 MED ORDER — IBUPROFEN 600 MG PO TABS: 600.0000 mg | ORAL_TABLET | Freq: Four times a day (QID) | ORAL | Status: DC | PRN

## 2014-07-13 MED ORDER — ONDANSETRON HCL 4 MG/2ML IJ SOLN: 4.0000 mg | Freq: Four times a day (QID) | INTRAMUSCULAR | Status: DC | PRN

## 2014-07-13 MED ORDER — ACETAMINOPHEN 325 MG PO TABS: 650.0000 mg | ORAL_TABLET | ORAL | Status: DC | PRN

## 2014-07-13 MED ORDER — BUPROPION HCL ER (SR) 150 MG PO TB12
150.0000 mg | ORAL_TABLET | Freq: Two times a day (BID) | ORAL | Status: DC
  Administered 2014-07-13: 150 mg via ORAL
  Filled 2014-07-13 (×4): qty 1

## 2014-07-13 MED ORDER — LIDOCAINE HCL (PF) 1 % IJ SOLN
30.0000 mL | INTRAMUSCULAR | Status: DC | PRN
  Filled 2014-07-13: qty 30

## 2014-07-13 MED ORDER — FENTANYL CITRATE 0.05 MG/ML IJ SOLN: 100.0000 ug | INTRAMUSCULAR | Status: DC | PRN

## 2014-07-13 MED ORDER — OXYCODONE-ACETAMINOPHEN 5-325 MG PO TABS: 1.0000 | ORAL_TABLET | ORAL | Status: DC | PRN

## 2014-07-13 MED ORDER — EPHEDRINE 5 MG/ML INJ
10.0000 mg | INTRAVENOUS | Status: DC | PRN
  Filled 2014-07-13: qty 2
  Filled 2014-07-13: qty 4

## 2014-07-13 MED ORDER — LACTATED RINGERS IV SOLN: 500.0000 mL | INTRAVENOUS | Status: DC | PRN

## 2014-07-13 MED ORDER — OXYTOCIN 40 UNITS IN LACTATED RINGERS INFUSION - SIMPLE MED: 62.5000 mL/h | INTRAVENOUS | Status: DC

## 2014-07-13 MED ORDER — FENTANYL 2.5 MCG/ML BUPIVACAINE 1/10 % EPIDURAL INFUSION (WH - ANES)
14.0000 mL/h | INTRAMUSCULAR | Status: DC | PRN
  Administered 2014-07-14: 14 mL/h via EPIDURAL
  Filled 2014-07-13 (×2): qty 125

## 2014-07-13 MED ORDER — LACTATED RINGERS IV SOLN: 500.0000 mL | Freq: Once | INTRAVENOUS | Status: DC

## 2014-07-13 MED ORDER — EPHEDRINE 5 MG/ML INJ
10.0000 mg | INTRAVENOUS | Status: DC | PRN
  Filled 2014-07-13: qty 2

## 2014-07-13 MED ORDER — PHENYLEPHRINE 40 MCG/ML (10ML) SYRINGE FOR IV PUSH (FOR BLOOD PRESSURE SUPPORT)
80.0000 ug | PREFILLED_SYRINGE | INTRAVENOUS | Status: DC | PRN
  Filled 2014-07-13: qty 10
  Filled 2014-07-13: qty 2

## 2014-07-13 NOTE — H&P (Signed)
Taylor Ford is a 28 y.o. female G2P0010 at [redacted]w[redacted]d gestation presenting for induction of labor for gestational diabetes. Maternal Medical History:  Reason for admission: Nausea.     Patient is on glyburide 2.5mg  and has sugars typically 100-125 daily in the morning. She has noticed very occasional irregular contractions that are mild. No gush of fluid. No vaginal bleeding/discharge. Positive fetal movement. She is GBS negative. Otherwise, prenatal workup is unremarkable. She desires epidural during pregnancy. No other complaints today. OB History   Grav Para Term Preterm Abortions TAB SAB Ect Mult Living   2 0 0 0 1 0 1 0 0 0      Past Medical History  Diagnosis Date  . Abnormal Pap smear     abnl 28 yo, all paps normal since  . HPV (human papilloma virus) infection   . Kidney stones     2012  . Constipation    Past Surgical History  Procedure Laterality Date  . Dilation and curettage of uterus     Family History: family history includes Cancer in her maternal grandfather; Diabetes in her mother; Heart disease in her paternal grandfather; Hypertension in her paternal grandmother. Social History:  reports that she has been smoking Cigarettes.  She has been smoking about 0.25 packs per day. She has never used smokeless tobacco. She reports that she does not drink alcohol or use illicit drugs.   Prenatal Transfer Tool  Maternal Diabetes: Yes:  Diabetes Type:  Insulin/Medication controlled Genetic Screening: Normal Maternal Ultrasounds/Referrals: Normal Fetal Ultrasounds or other Referrals:  None Maternal Substance Abuse:  No Significant Maternal Medications:  None Significant Maternal Lab Results:  None Other Comments:  None  Review of Systems  Constitutional: Negative for fever and chills.  HENT: Negative for hearing loss.   Eyes: Negative for blurred vision and double vision.  Respiratory: Negative for cough and shortness of breath.   Cardiovascular: Negative for chest  pain and palpitations.  Gastrointestinal: Negative for heartburn, nausea, vomiting, abdominal pain, diarrhea and constipation.  Genitourinary: Negative for dysuria and urgency.  Musculoskeletal: Negative for myalgias.  Skin: Negative for itching and rash.  Neurological: Negative for dizziness, tingling and headaches.    Dilation: 1.5 Effacement (%): 50 Station: 0 Exam by:: Zonia Kief MD  Blood pressure 138/82, pulse 108, temperature 99.2 F (37.3 C), temperature source Oral, resp. rate 16, height 5\' 2"  (1.575 m), weight 88.451 kg (195 lb), last menstrual period 10/10/2013. Maternal Exam:  Introitus: Vagina is negative for discharge.    Physical Exam  Constitutional: She is oriented to person, place, and time. She appears well-developed and well-nourished.  28 year old female sitting up in bed appears very comfortable  Cardiovascular: Normal rate and regular rhythm.   Respiratory: Effort normal and breath sounds normal.  GI: Soft. Bowel sounds are normal. There is no tenderness.  gravid  Genitourinary: Vagina normal. No vaginal discharge found.  Musculoskeletal: She exhibits edema. She exhibits no tenderness.  Neurological: She is alert and oriented to person, place, and time.  Skin: Skin is warm and dry.    Prenatal labs: ABO, Rh: A/POS/-- (02/17 1516) Antibody: NEG (02/17 1516) Rubella: 0.70 (02/17 1516) RPR: NON REAC (05/13 1435)  HBsAg: NEGATIVE (02/17 1516)  HIV: NONREACTIVE (05/13 1435)  GBS: Negative (07/20 0000)   Assessment/Plan: 28 year old G2P0010 [redacted]w[redacted]d gestation with gestational diabetes presenting for induction of labor.  Bishop's score 5. Will start cervical ripening with foley bulb/cytotec. #Admit to L&D check CBC, RPR.  #Anesthesia- Epidural when  ready #GBS negative #Breast/bottle feed #Undecided contraception. #GDM- check glucose now.  Patient evaluated with Francene FindersKimberly Shaw Stephens, Devin A 07/13/2014, 9:11 PM  I have seen and examined this patient and  I agree with the above. FHR 140-150s, +accels, no decels. Irreg ctx. Cam HaiSHAW, KIMBERLY CNM 10:48 PM 07/13/2014

## 2014-07-14 ENCOUNTER — Encounter (HOSPITAL_COMMUNITY): Payer: Medicaid Other | Admitting: Anesthesiology

## 2014-07-14 ENCOUNTER — Encounter (HOSPITAL_COMMUNITY): Payer: Self-pay

## 2014-07-14 ENCOUNTER — Inpatient Hospital Stay (HOSPITAL_COMMUNITY): Payer: Medicaid Other | Admitting: Anesthesiology

## 2014-07-14 DIAGNOSIS — O99334 Smoking (tobacco) complicating childbirth: Secondary | ICD-10-CM

## 2014-07-14 DIAGNOSIS — B977 Papillomavirus as the cause of diseases classified elsewhere: Secondary | ICD-10-CM

## 2014-07-14 DIAGNOSIS — O99814 Abnormal glucose complicating childbirth: Secondary | ICD-10-CM

## 2014-07-14 DIAGNOSIS — O98519 Other viral diseases complicating pregnancy, unspecified trimester: Secondary | ICD-10-CM

## 2014-07-14 LAB — GLUCOSE, CAPILLARY
GLUCOSE-CAPILLARY: 69 mg/dL — AB (ref 70–99)
GLUCOSE-CAPILLARY: 55 mg/dL — AB (ref 70–99)
Glucose-Capillary: 57 mg/dL — ABNORMAL LOW (ref 70–99)
Glucose-Capillary: 58 mg/dL — ABNORMAL LOW (ref 70–99)
Glucose-Capillary: 79 mg/dL (ref 70–99)

## 2014-07-14 LAB — ABO/RH: ABO/RH(D): A POS

## 2014-07-14 LAB — RPR

## 2014-07-14 MED ORDER — MISOPROSTOL 50MCG HALF TABLET
50.0000 ug | ORAL_TABLET | ORAL | Status: DC
  Administered 2014-07-14 (×2): 50 ug via ORAL
  Filled 2014-07-14 (×9): qty 1

## 2014-07-14 MED ORDER — GLYBURIDE 2.5 MG PO TABS
2.5000 mg | ORAL_TABLET | Freq: Two times a day (BID) | ORAL | Status: DC
  Administered 2014-07-14: 2.5 mg via ORAL
  Filled 2014-07-14 (×3): qty 1

## 2014-07-14 MED ORDER — LIDOCAINE HCL (PF) 1 % IJ SOLN
INTRAMUSCULAR | Status: DC | PRN
  Administered 2014-07-14 (×2): 8 mL

## 2014-07-14 MED ORDER — TERBUTALINE SULFATE 1 MG/ML IJ SOLN: 0.2500 mg | Freq: Once | INTRAMUSCULAR | Status: AC | PRN

## 2014-07-14 MED ORDER — DEXTROSE 5 % IN LACTATED RINGERS IV BOLUS
500.0000 mL | Freq: Once | INTRAVENOUS | Status: AC
  Administered 2014-07-14: 500 mL via INTRAVENOUS

## 2014-07-14 MED ORDER — OXYTOCIN 40 UNITS IN LACTATED RINGERS INFUSION - SIMPLE MED
1.0000 m[IU]/min | INTRAVENOUS | Status: DC
  Administered 2014-07-14: 2 m[IU]/min via INTRAVENOUS
  Filled 2014-07-14: qty 1000

## 2014-07-14 MED ORDER — FENTANYL 2.5 MCG/ML BUPIVACAINE 1/10 % EPIDURAL INFUSION (WH - ANES)
INTRAMUSCULAR | Status: DC | PRN
  Administered 2014-07-14: 14 mL/h via EPIDURAL

## 2014-07-14 NOTE — Anesthesia Preprocedure Evaluation (Addendum)
Anesthesia Evaluation  Patient identified by MRN, date of birth, ID band Patient awake    Reviewed: Allergy & Precautions, H&P , NPO status , Patient's Chart, lab work & pertinent test results  Airway Mallampati: II TM Distance: >3 FB Neck ROM: full    Dental no notable dental hx.    Pulmonary Current Smoker,    Pulmonary exam normal       Cardiovascular negative cardio ROS      Neuro/Psych negative neurological ROS     GI/Hepatic negative GI ROS, Neg liver ROS,   Endo/Other  diabetes, Gestational, Oral Hypoglycemic Agents  Renal/GU      Musculoskeletal   Abdominal Normal abdominal exam  (+)   Peds  Hematology negative hematology ROS (+)   Anesthesia Other Findings   Reproductive/Obstetrics (+) Pregnancy                          Anesthesia Physical Anesthesia Plan  ASA: II  Anesthesia Plan: Epidural   Post-op Pain Management:    Induction:   Airway Management Planned:   Additional Equipment:   Intra-op Plan:   Post-operative Plan:   Informed Consent: I have reviewed the patients History and Physical, chart, labs and discussed the procedure including the risks, benefits and alternatives for the proposed anesthesia with the patient or authorized representative who has indicated his/her understanding and acceptance.     Plan Discussed with:   Anesthesia Plan Comments:        Anesthesia Quick Evaluation

## 2014-07-14 NOTE — Progress Notes (Signed)
LABOR PROGRESS NOTE  Taylor Ford is a 28 y.o. G2P0010 at 7346w1d  admitted for induction of labor due to gestational DM.  Subjective: Feeling some back pain, otherwise comfortable.  Objective: BP 122/72  Pulse 89  Temp(Src) 98.7 F (37.1 C) (Oral)  Resp 16  Ht 5\' 2"  (1.575 m)  Wt 195 lb (88.451 kg)  BMI 35.66 kg/m2  LMP 10/10/2013 or  Filed Vitals:   07/14/14 0310 07/14/14 0607 07/14/14 0821 07/14/14 1035  BP: 133/76 118/62 124/77 122/72  Pulse: 95 81 91 89  Temp: 98.8 F (37.1 C) 98.7 F (37.1 C)    TempSrc: Oral Oral    Resp: 16 16  16   Height:      Weight:           FHT:  FHR: 145 bpm, variability: moderate,  accelerations:  Present,  decelerations:  Absent UC:   irregular, every 4 minutes SVE:   Dilation: 5 Effacement (%): 70 Station: -2 Exam by:: Loreta AveAcosta  Dilation: 5 Effacement (%): 70 Station: -2 Presentation: Vertex Exam by:: Loreta AveAcosta  FB out at 1100  Labs: Lab Results  Component Value Date   WBC 14.4* 07/13/2014   HGB 11.4* 07/13/2014   HCT 35.1* 07/13/2014   MCV 94.6 07/13/2014   PLT 464* 07/13/2014    Assessment / Plan: Induction of labor due to gestational diabetes,  progressing well on pitocin  Labor: Progressing normally, start pitocin for inadequate contraction pattern Fetal Wellbeing:  Category I Pain Control:  Epidural upon request Anticipated MOD:  NSVD  Taylor Palardy ROCIO, MD 07/14/2014, 11:11 AM

## 2014-07-14 NOTE — Anesthesia Procedure Notes (Signed)
Epidural Patient location during procedure: OB Start time: 07/14/2014 12:27 PM End time: 07/14/2014 12:37 PM  Staffing Anesthesiologist: Leilani AbleHATCHETT, Hubbard Seldon Performed by: anesthesiologist   Preanesthetic Checklist Completed: patient identified, surgical consent, pre-op evaluation, timeout performed, IV checked, risks and benefits discussed and monitors and equipment checked  Epidural Patient position: sitting Prep: site prepped and draped and DuraPrep Patient monitoring: continuous pulse ox and blood pressure Approach: midline Location: L3-L4 Injection technique: LOR air  Needle:  Needle type: Tuohy  Needle gauge: 17 G Needle length: 9 cm and 9 Needle insertion depth: 6 cm Catheter type: closed end flexible Catheter size: 19 Gauge Catheter at skin depth: 11 cm Test dose: negative and Other  Assessment Sensory level: T9 Events: blood not aspirated, injection not painful, no injection resistance, negative IV test and no paresthesia  Additional Notes Reason for block:procedure for pain

## 2014-07-14 NOTE — Progress Notes (Signed)
   Taylor Ford is a 28 y.o. G2P0010 at 5238w1d  admitted for induction of labor due to Diabetes.  Subjective: No pressure  Objective: Filed Vitals:   07/14/14 1631 07/14/14 1701 07/14/14 1732 07/14/14 1801  BP: 125/79 143/97 124/89 133/84  Pulse: 94 98 94 104  Temp:      TempSrc:      Resp:  16    Height:      Weight:      SpO2:          FHT:  FHR: 140 bpm, variability: moderate,  accelerations:  Present,  decelerations:  Absent UC:   regular, every 3 minutes SVE:   Dilation: 10 Effacement (%): 100 Station: +2 Exam by:: fran cresenzo-dishmon, cnm Blood sugar 55  Labs: Lab Results  Component Value Date   WBC 14.4* 07/13/2014   HGB 11.4* 07/13/2014   HCT 35.1* 07/13/2014   MCV 94.6 07/13/2014   PLT 464* 07/13/2014    Assessment / Plan: IOL for A2DM, progressing well Apple juice/popsicle PO and D5LR Labor: Progressing normally Fetal Wellbeing:  Category I Pain Control:  Epidural Anticipated MOD:  NSVD  CRESENZO-DISHMAN,Taylor Ford 07/14/2014, 6:23 PM

## 2014-07-14 NOTE — Progress Notes (Signed)
Taylor Ford is a 28 y.o. G2P0010 at 5824w1d by ultrasound admitted for induction of labor due to Gestational diabetes.  Subjective: Pt. Comfortable at this time. No complaints. Feeling some discomfort. Attempted IUPC placement.   Objective: BP 132/67  Pulse 91  Temp(Src) 98.5 F (36.9 C) (Oral)  Resp 16  Ht 5\' 2"  (1.575 m)  Wt 88.451 kg (195 lb)  BMI 35.66 kg/m2  SpO2 98%  LMP 10/10/2013      FHT:  FHR: 140 bpm, variability: minimal ,  accelerations:  Present,  decelerations:  Present Absent at this time. Variable decels x 2 around 2 pm.  UC:   regular, every 6 minutes SVE:   Dilation: 8 Effacement (%): 100 Station: 0 Exam by:: Melacon, MD  Labs: Lab Results  Component Value Date   WBC 14.4* 07/13/2014   HGB 11.4* 07/13/2014   HCT 35.1* 07/13/2014   MCV 94.6 07/13/2014   PLT 464* 07/13/2014    Assessment / Plan: Induction of labor due to gestational diabetes,  progressing well on pitocin  Labor: Progressing as expected. Dialated to 8cm at thist time. Will continue to monitor and recheck.  Preeclampsia:  no signs or symptoms of toxicity Fetal Wellbeing:  Category II Pain Control:  Epidural I/D:  n/a Anticipated MOD:  NSVD  Taylor Ford G 07/14/2014, 2:11 PM

## 2014-07-14 NOTE — Progress Notes (Signed)
Taylor Ford is Ford 28 y.o. G2P0010 at 4973w1d by ultrasound admitted for induction of labor due to Gestational diabetes.  Subjective: Patient very relaxed, feels no pain, not aware of contractions.  Objective: BP 141/78  Pulse 99  Temp(Src) 98.6 F (37 C) (Oral)  Resp 18  Ht 5\' 2"  (1.575 m)  Wt 88.451 kg (195 lb)  BMI 35.66 kg/m2  SpO2 98%  LMP 10/10/2013 I/O last 3 completed shifts: In: -  Out: 700 [Urine:700]    FHT:  FHR: 140 bpm, variability: moderate,  accelerations:  Present,  decelerations:  Absent UC:   regular, every 2-3 minutes SVE:   Dilation: 10 Effacement (%): 100 Station: +2 Exam by:: Taylor Ford, cnm  Labs: Lab Results  Component Value Date   WBC 14.4* 07/13/2014   HGB 11.4* 07/13/2014   HCT 35.1* 07/13/2014   MCV 94.6 07/13/2014   PLT 464* 07/13/2014    Assessment / Plan: Induction of labor due to gestational diabetes,  progressing well on pitocin. Start pushing.  Labor: Progressing normally Preeclampsia:  no signs or symptoms of toxicity Fetal Wellbeing:  Category I Pain Control:  Epidural I/D:  n/Ford Anticipated MOD:  NSVD  Seen with Taylor Ford  Taylor Ford 07/14/2014, 8:56 PM

## 2014-07-15 ENCOUNTER — Encounter (HOSPITAL_COMMUNITY): Payer: Self-pay

## 2014-07-15 LAB — COMPREHENSIVE METABOLIC PANEL
ALT: 19 U/L (ref 0–35)
AST: 23 U/L (ref 0–37)
Albumin: 2.2 g/dL — ABNORMAL LOW (ref 3.5–5.2)
Alkaline Phosphatase: 143 U/L — ABNORMAL HIGH (ref 39–117)
Anion gap: 15 (ref 5–15)
BUN: 8 mg/dL (ref 6–23)
CO2: 20 mEq/L (ref 19–32)
Calcium: 9.5 mg/dL (ref 8.4–10.5)
Chloride: 104 mEq/L (ref 96–112)
Creatinine, Ser: 0.85 mg/dL (ref 0.50–1.10)
GFR calc Af Amer: >90 mL/min (ref >90)
GFR calc non Af Amer: >90 mL/min (ref >90)
Glucose, Bld: 123 mg/dL — ABNORMAL HIGH (ref 70–99)
Potassium: 3.8 mEq/L (ref 3.7–5.3)
SODIUM: 139 meq/L (ref 137–147)
TOTAL PROTEIN: 5 g/dL — AB (ref 6.0–8.3)
Total Bilirubin: <0.2 mg/dL — ABNORMAL LOW (ref 0.3–1.2)

## 2014-07-15 LAB — CBC
HCT: 31.1 % — ABNORMAL LOW (ref 36.0–46.0)
HEMOGLOBIN: 10.3 g/dL — AB (ref 12.0–15.0)
MCH: 31 pg (ref 26.0–34.0)
MCHC: 33.1 g/dL (ref 30.0–36.0)
MCV: 93.7 fL (ref 78.0–100.0)
Platelets: 384 10*3/uL (ref 150–400)
RBC: 3.32 MIL/uL — ABNORMAL LOW (ref 3.87–5.11)
RDW: 15.3 % (ref 11.5–15.5)
WBC: 21.5 10*3/uL — AB (ref 4.0–10.5)

## 2014-07-15 LAB — PROTEIN / CREATININE RATIO, URINE
CREATININE, URINE: 97.62 mg/dL
Protein Creatinine Ratio: 0.12 (ref 0.00–0.15)
TOTAL PROTEIN, URINE: 11.4 mg/dL

## 2014-07-15 MED ORDER — PRENATAL MULTIVITAMIN CH
1.0000 | ORAL_TABLET | Freq: Every day | ORAL | Status: DC
  Administered 2014-07-15 – 2014-07-16 (×2): 1 via ORAL
  Filled 2014-07-15 (×2): qty 1

## 2014-07-15 MED ORDER — IBUPROFEN 600 MG PO TABS
600.0000 mg | ORAL_TABLET | Freq: Four times a day (QID) | ORAL | Status: DC
  Administered 2014-07-15 – 2014-07-16 (×6): 600 mg via ORAL
  Filled 2014-07-15 (×6): qty 1

## 2014-07-15 MED ORDER — LANOLIN HYDROUS EX OINT: TOPICAL_OINTMENT | CUTANEOUS | Status: DC | PRN

## 2014-07-15 MED ORDER — WITCH HAZEL-GLYCERIN EX PADS: 1.0000 "application " | MEDICATED_PAD | CUTANEOUS | Status: DC | PRN

## 2014-07-15 MED ORDER — OXYCODONE-ACETAMINOPHEN 5-325 MG PO TABS
1.0000 | ORAL_TABLET | ORAL | Status: DC | PRN
  Administered 2014-07-15: 1 via ORAL
  Filled 2014-07-15: qty 1

## 2014-07-15 MED ORDER — SENNOSIDES-DOCUSATE SODIUM 8.6-50 MG PO TABS
2.0000 | ORAL_TABLET | ORAL | Status: DC
  Administered 2014-07-15 – 2014-07-16 (×2): 2 via ORAL
  Filled 2014-07-15 (×2): qty 2

## 2014-07-15 MED ORDER — OXYTOCIN 40 UNITS IN LACTATED RINGERS INFUSION - SIMPLE MED: 62.5000 mL/h | INTRAVENOUS | Status: DC | PRN

## 2014-07-15 MED ORDER — BISACODYL 10 MG RE SUPP
10.0000 mg | Freq: Every day | RECTAL | Status: DC | PRN
Start: 2014-07-15 — End: 2014-07-16

## 2014-07-15 MED ORDER — TETANUS-DIPHTH-ACELL PERTUSSIS 5-2.5-18.5 LF-MCG/0.5 IM SUSP: 0.5000 mL | Freq: Once | INTRAMUSCULAR | Status: DC

## 2014-07-15 MED ORDER — BENZOCAINE-MENTHOL 20-0.5 % EX AERO
1.0000 "application " | INHALATION_SPRAY | CUTANEOUS | Status: DC | PRN
  Administered 2014-07-15: 1 via TOPICAL
  Filled 2014-07-15: qty 56

## 2014-07-15 MED ORDER — ZOLPIDEM TARTRATE 5 MG PO TABS: 5.0000 mg | ORAL_TABLET | Freq: Every evening | ORAL | Status: DC | PRN

## 2014-07-15 MED ORDER — ONDANSETRON HCL 4 MG PO TABS: 4.0000 mg | ORAL_TABLET | ORAL | Status: DC | PRN

## 2014-07-15 MED ORDER — FLEET ENEMA 7-19 GM/118ML RE ENEM: 1.0000 | ENEMA | Freq: Every day | RECTAL | Status: DC | PRN

## 2014-07-15 MED ORDER — DIBUCAINE 1 % RE OINT: 1.0000 "application " | TOPICAL_OINTMENT | RECTAL | Status: DC | PRN

## 2014-07-15 MED ORDER — MEASLES, MUMPS & RUBELLA VAC ~~LOC~~ INJ
0.5000 mL | INJECTION | Freq: Once | SUBCUTANEOUS | Status: AC
  Administered 2014-07-16: 0.5 mL via SUBCUTANEOUS
  Filled 2014-07-15 (×2): qty 0.5

## 2014-07-15 MED ORDER — METHYLERGONOVINE MALEATE 0.2 MG PO TABS: 0.2000 mg | ORAL_TABLET | ORAL | Status: DC | PRN

## 2014-07-15 MED ORDER — FERROUS SULFATE 325 (65 FE) MG PO TABS
325.0000 mg | ORAL_TABLET | Freq: Two times a day (BID) | ORAL | Status: DC
  Administered 2014-07-15 – 2014-07-16 (×3): 325 mg via ORAL
  Filled 2014-07-15 (×3): qty 1

## 2014-07-15 MED ORDER — ONDANSETRON HCL 4 MG/2ML IJ SOLN: 4.0000 mg | INTRAMUSCULAR | Status: DC | PRN

## 2014-07-15 MED ORDER — SIMETHICONE 80 MG PO CHEW: 80.0000 mg | CHEWABLE_TABLET | ORAL | Status: DC | PRN

## 2014-07-15 MED ORDER — DIPHENHYDRAMINE HCL 25 MG PO CAPS: 25.0000 mg | ORAL_CAPSULE | Freq: Four times a day (QID) | ORAL | Status: DC | PRN

## 2014-07-15 MED ORDER — METHYLERGONOVINE MALEATE 0.2 MG/ML IJ SOLN: 0.2000 mg | INTRAMUSCULAR | Status: DC | PRN

## 2014-07-15 NOTE — Progress Notes (Signed)
UR chart review completed.  

## 2014-07-15 NOTE — Anesthesia Postprocedure Evaluation (Signed)
Anesthesia Post Note  Patient: Taylor Ford  Procedure(s) Performed: * No procedures listed *  Anesthesia type: Epidural  Patient location: Mother/Baby  Post pain: Pain level controlled  Post assessment: Post-op Vital signs reviewed  Last Vitals:  Filed Vitals:   07/15/14 0535  BP: 143/75  Pulse: 119  Temp: 37.3 C  Resp: 18    Post vital signs: Reviewed  Level of consciousness:alert  Complications: No apparent anesthesia complications

## 2014-07-15 NOTE — Lactation Note (Signed)
This note was copied from the chart of Boy The ServiceMaster CompanyCrystal Farruggia. Lactation Consultation Note Initial visit done.  Breastfeeding consultation services and support information given.  Mom states baby has been latching easily and nursing well since birth. Reviewed feeding cues and instructed to feed baby with any cue.  Reviewed nipple care. Encouraged to call with any concerns prn.  Patient Name: Boy Ida RogueCrystal Biel Today's Date: 07/15/2014 Reason for consult: Initial assessment   Maternal Data Formula Feeding for Exclusion: No Has patient been taught Hand Expression?: Yes Does the patient have breastfeeding experience prior to this delivery?: No  Feeding Feeding Type: Breast Fed  LATCH Score/Interventions Latch: Grasps breast easily, tongue down, lips flanged, rhythmical sucking. Intervention(s): Adjust position  Audible Swallowing: A few with stimulation Intervention(s): Skin to skin  Type of Nipple: Everted at rest and after stimulation  Comfort (Breast/Nipple): Soft / non-tender     Hold (Positioning): No assistance needed to correctly position infant at breast.  LATCH Score: 9  Lactation Tools Discussed/Used     Consult Status Consult Status: Follow-up Date: 07/16/14 Follow-up type: In-patient    Hansel Feinsteinowell, Andrew Soria Ann 07/15/2014, 5:36 PM

## 2014-07-15 NOTE — Progress Notes (Signed)
Post Partum Day 1. Subjective: no complaints, up ad lib, voiding and tolerating PO. Pain is well controlled and bleeding is minimal (only enough to saturate 2 pads in 9 hours)  Objective: Blood pressure 143/75, pulse 119, temperature 99.1 F (37.3 C), temperature source Oral, resp. rate 18, height 5\' 2"  (1.575 m), weight 88.451 kg (195 lb), last menstrual period 10/10/2013, SpO2 98.00%, unknown if currently breastfeeding.  Physical Exam:  General: alert, cooperative, appears stated age and no distress Lochia: appropriate Uterine Fundus: firm Incision: Perineal tear sore but no other complaints.  Examination deferred.   DVT Evaluation: No evidence of DVT seen on physical exam. Negative Homan's sign. No cords or calf tenderness. Calf/Ankle edema is present.   Recent Labs  07/13/14 2110  HGB 11.4*  HCT 35.1*    Assessment/Plan: Plan for discharge tomorrow   LOS: 2 days   Emerson MonteMertz, Christopher 07/15/2014, 7:24 AM   Addendum Reviewed baseline and labor/PP BPs.  H/A yesterday relieved by Motrin. No H/A now.   Filed Vitals:   07/15/14 0018 07/15/14 0055 07/15/14 0155 07/15/14 0535  BP:  142/87 143/73 143/75  Pulse:  118 114 119  Temp: 99.9 F (37.7 C) 99.4 F (37.4 C) 98.8 F (37.1 C) 99.1 F (37.3 C)  TempSrc: Oral Oral Oral Oral  Resp:  18 18 18   Height:      Weight:      SpO2:  98% 99% 98%    Abd soft, NT, involuting.  PreE labs sent.   Evaluation and management procedures were performed by Resident physician under my supervision/collaboration. Chart reviewed, patient examined by me and I agree with management and plan.

## 2014-07-16 MED ORDER — NORETHINDRONE 0.35 MG PO TABS: 1.0000 | ORAL_TABLET | Freq: Every day | ORAL | Status: AC

## 2014-07-16 MED ORDER — IBUPROFEN 600 MG PO TABS: 600.0000 mg | ORAL_TABLET | Freq: Four times a day (QID) | ORAL | Status: AC

## 2014-07-16 NOTE — Lactation Note (Signed)
This note was copied from the chart of Boy The Service[employer] CompanyCrystal Bures. Lactation Consultation Note  Patient Name: Boy Ida RogueCrystal Torti ZOXWR'UToday's Date: 07/16/2014 Reason for consult: Follow-up assessment  Infant has breastfed x8 (15-30 min) in past 24 hrs +1 (7 min) with LS-9 by RN; voids-4 in 24 hrs and life; stools-4 in 24 hrs and life.  Infant is 3836 hrs old and has a 6% weight loss.  LC asked mom to call at next feeding so LC could assess feeding.  Mom called and mom was allowing infant to self-latch.  [facility] taught mom how to sandwich breast for latching with depth.  Infant latched with depth and lots swallows heard.  LS-10.  Mom stated the nipples are tender at beginning of feeding but it "goes away after a few seconds".  Mom denies and nipple "pinched" appearance at end of feeding.  Encouragement given to mom.  Informed of outpatient services and hospital support group.  Mom has hand pump for discharge use.  Demonstrated to mom how to use hand pump to prevent engorgement and encouraged to call for questions after discharge if needed.      Maternal Data    Feeding Feeding Type: Breast Fed  LATCH Score/Interventions Latch: Grasps breast easily, tongue down, lips flanged, rhythmical sucking.  Audible Swallowing: Spontaneous and intermittent Intervention(s): Skin to skin  Type of Nipple: Everted at rest and after stimulation  Comfort (Breast/Nipple): Soft / non-tender     Hold (Positioning): No assistance needed to correctly position infant at breast. Intervention(s): Support Pillows;Breastfeeding basics reviewed;Skin to skin;Position options  LATCH Score: 10  Lactation Tools Discussed/Used     Consult Status Consult Status: Complete    Lendon KaVann, Azari Hasler Walker 07/16/2014, 12:05 PM

## 2014-07-16 NOTE — Discharge Summary (Signed)
Obstetric Discharge Summary Reason for Admission: induction of labor Prenatal Procedures: NST Intrapartum Procedures: spontaneous vaginal delivery Postpartum Procedures: none Complications-Operative and Postpartum: none Hemoglobin  Date Value Ref Range Status  07/15/2014 10.3* 12.0 - 15.0 g/dL Final     HCT  Date Value Ref Range Status  07/15/2014 31.1* 36.0 - 46.0 % Final   Hospital Course:  Patient is on glyburide 2.5mg  and has sugars typically 100-125 daily in the morning. She has noticed very occasional irregular contractions that are mild. No gush of fluid. No vaginal bleeding/discharge. Positive fetal movement. She is GBS negative. Otherwise, prenatal workup is unremarkable. She desires epidural during pregnancy. No other complaints today.  Pt. Was admitted for induction. She was induced with cytotec, foley bulb, and pitocin as indicated. She subsequently progressed to NSVD without incident. Her intrapartum and postpartum course remained uncomplicated. She is ambulating, tolerating po, +BM, +Flatus, pain well controlled, minimal bleeding, she is stable and ready for discharge. She desires Oral contraceptives for birth control.    Delivery Note  At 2253, after about 1/5 hours of pushing, a viable female was delivered via (Presentation:LOA ). The shoulders were not forthcoming, so the posterior (left) axilla was grasped with my index finger, and the baby was rotated clockwise into the oblique diameter. At this point, the (now) anterior shoulder was released, and the baby delivered. At no time was any traction placed on the baby's head.  APGAR: 9/9 ; weight pending. 40 units of pitocin diluted in 1000cc LR was infused rapidly IV. The placenta separated spontaneously and delivered via CCT and maternal pushing effort. It was inspected and appears to be intact with a 3 VC.  There were the following complications:  Anesthesia: Epidural  Episiotomy:  Lacerations:  Suture Repair: 3.0 vicryl  Est.  Blood Loss (mL): 200  Mom to postpartum. Baby to Couplet care / Skin to Skin.  Delivery and repair by Dr. Andria MeuseStevens under my supervision   Physical Exam:  General: alert, cooperative and no distress Lochia: appropriate Uterine Fundus: firm Incision: N/A DVT Evaluation: No evidence of DVT seen on physical exam. No cords or calf tenderness.  Discharge Diagnoses: Term Pregnancy-delivered  Discharge Information: Date: 07/16/2014 Activity: unrestricted and pelvic rest Diet: routine Medications: PNV and Ibuprofen Condition: stable Instructions: refer to practice specific booklet Discharge to: home Follow-up Information   [facility] Schedule an appointment as soon as possible for a visit in 6 weeks. (postpartum follow up)    Specialty:  Obstetrics and Gynecology   Contact information:   [address] [place] [address] [phone]      Newborn Data: Live born female  Birth Weight: 8 lb 1.1 oz (3660 g) APGAR: 9, 9  Home with mother.  Ford, Taylor G 07/16/2014, 9:32 AM  Post Partum Day 1 I have seen and examined this patient and agree with above documentation in the resident's note.  Taylor Ford is a 28 y.o. G2P1011 s/p SVD.  Pt denies problems with ambulating, voiding or po intake. Pain is well controlled.  .  Plan for birth control is OCPs.  Method of Feeding: breast PE:  BP 125/76  Pulse 93  Temp(Src) 97.9 F (36.6 C) (Oral)  Resp 18  Ht 5\' 2"  (1.575 m)  Wt 88.451 kg (195 lb)  BMI 35.66 kg/m2  SpO2 98%  LMP 10/10/2013  Breastfeeding? Unknown Fundus firm Plan for discharge: today

## 2014-07-16 NOTE — Discharge Instructions (Signed)
Before Baby Comes Home °Ask any questions about feeding, diapering, and baby care before you leave the hospital. Ask again if you do not understand. Ask when you need to see the doctor again. °There are several things you must have before your baby comes home. °· Infant car seat. °· Crib. °¨ Do not let your baby sleep in a bed with you or anyone else. °¨ If you do not have a bed for your baby, ask the doctor what you can use that will be safe for the baby to sleep in. °Infant feeding supplies: °· 6 to 8 bottles (8 ounce size). °· 6 to 8 nipples. °· Measuring cup. °· Measuring tablespoon. °· Bottle brush. °· Sterilizer (or use any large pan or kettle with a lid). °· Formula that contains iron. °· A way to boil and cool water. °Breastfeeding supplies: °· Breast pump. °· Nipple cream. °Clothing: °· 24 to 36 cloth diapers and waterproof diaper covers or a box of disposable diapers. You may need as many as 10 to 12 diapers per day. °· 3 onesies (other clothing will depend on the time of year and the weather). °· 3 receiving blankets. °· 3 baby pajamas or gowns. °· 3 bibs. °Bath equipment: °· Mild soap. °· Petroleum jelly. No baby oil or powder. °· Soft cloth towel and washcloth. °· Cotton balls. °· Separate bath basin for baby. Only sponge bathe until umbilical cord and circumcision are healed. °Other supplies: °· Thermometer and bulb syringe (ask the hospital to send them home with you). Ask your doctor about how you should take your baby's temperature. °· One to two pacifiers. °Prepare for an emergency: °· Know how to get to the hospital and know where to admit your baby. °· Put all doctor numbers near your house phone and in your cell phone if you have one. °Prepare your family: °· Talk with siblings about the baby coming home and how they feel about it. °· Decide how you want to handle visitors and other family members. °· Take offers for help with the baby. You will need time to adjust. °Know when to call the  doctor.  °GET HELP RIGHT AWAY IF: °· Your baby's temperature is greater than 100.4°F (38°C). °· The soft spot on your baby's head starts to bulge. °· Your baby is crying with no tears or has no wet diapers for 6 hours. °· Your baby has rapid breathing. °· Your baby is not as alert. °Document Released: 11/07/2008 Document Revised: 04/11/2014 Document Reviewed: 02/14/2011 °[employer]® Patient Information ©2015 [employer]. This information is not intended to replace advice given to you by your health care provider. Make sure you discuss any questions you have with your health care provider. °Vaginal Delivery, Care After °Refer to this sheet in the next few weeks. These discharge instructions provide you with information on caring for yourself after delivery. Your caregiver may also give you specific instructions. Your treatment has been planned according to the most current medical practices available, but problems sometimes occur. Call your caregiver if you have any problems or questions after you go home. °HOME CARE INSTRUCTIONS °· Take over-the-counter or prescription medicines only as directed by your caregiver or pharmacist. °· Do not drink alcohol, especially if you are breastfeeding or taking medicine to relieve pain. °· Do not chew or smoke tobacco. °· Do not use illegal drugs. °· Continue to use good perineal care. Good perineal care includes: °¨ Wiping your perineum from front to back. °¨ Keeping your   perineum clean. °· Do not use tampons or douche until your caregiver says it is okay. °· Shower, wash your hair, and take tub baths as directed by your caregiver. °· Wear a well-fitting bra that provides breast support. °· Eat healthy foods. °· Drink enough fluids to keep your urine clear or pale yellow. °· Eat high-fiber foods such as whole grain cereals and breads, brown rice, beans, and fresh fruits and vegetables every day. These foods may help prevent or relieve constipation. °· Follow your caregiver's  recommendations regarding resumption of activities such as climbing stairs, driving, lifting, exercising, or traveling. °· Talk to your caregiver about resuming sexual activities. Resumption of sexual activities is dependent upon your risk of infection, your rate of healing, and your comfort and desire to resume sexual activity. °· Try to have someone help you with your household activities and your newborn for at least a few days after you leave the hospital. °· Rest as much as possible. Try to rest or take a nap when your newborn is sleeping. °· Increase your activities gradually. °· Keep all of your scheduled postpartum appointments. It is very important to keep your scheduled follow-up appointments. At these appointments, your caregiver will be checking to make sure that you are healing physically and emotionally. °SEEK MEDICAL CARE IF:  °· You are passing large clots from your vagina. Save any clots to show your caregiver. °· You have a foul smelling discharge from your vagina. °· You have trouble urinating. °· You are urinating frequently. °· You have pain when you urinate. °· You have a change in your bowel movements. °· You have increasing redness, pain, or swelling near your vaginal incision (episiotomy) or vaginal tear. °· You have pus draining from your episiotomy or vaginal tear. °· Your episiotomy or vaginal tear is separating. °· You have painful, hard, or reddened breasts. °· You have a severe headache. °· You have blurred vision or see spots. °· You feel sad or depressed. °· You have thoughts of hurting yourself or your newborn. °· You have questions about your care, the care of your newborn, or medicines. °· You are dizzy or light-headed. °· You have a rash. °· You have nausea or vomiting. °· You were breastfeeding and have not had a menstrual period within 12 weeks after you stopped breastfeeding. °· You are not breastfeeding and have not had a menstrual period by the 12th week after  delivery. °· You have a fever. °SEEK IMMEDIATE MEDICAL CARE IF:  °· You have persistent pain. °· You have chest pain. °· You have shortness of breath. °· You faint. °· You have leg pain. °· You have stomach pain. °· Your vaginal bleeding saturates two or more sanitary pads in 1 hour. °MAKE SURE YOU:  °· Understand these instructions. °· Will watch your condition. °· Will get help right away if you are not doing well or get worse. °Document Released: 11/22/2000 Document Revised: 04/11/2014 Document Reviewed: 07/22/2012 °[employer]® Patient Information ©2015 [employer]. This information is not intended to replace advice given to you by your health care provider. Make sure you discuss any questions you have with your health care provider. ° °

## 2014-07-24 NOTE — Progress Notes (Signed)
NST 06/16/14 reactive

## 2014-07-29 NOTE — Addendum Note (Signed)
Encounter addended by: Rema FendtJoshua Nyomie Ehrlich, MD on: 07/29/2014 10:56 AM<BR>     Documentation filed: Charges VN

## 2014-08-17 ENCOUNTER — Ambulatory Visit (INDEPENDENT_AMBULATORY_CARE_PROVIDER_SITE_OTHER): Payer: Medicaid Other | Admitting: Obstetrics & Gynecology

## 2014-08-17 ENCOUNTER — Encounter: Payer: Self-pay | Admitting: Obstetrics & Gynecology

## 2014-08-17 NOTE — Progress Notes (Signed)
Patient ID: Taylor Ford, female   DOB: Apr 01, 1986, 28 y.o.   MRN: [id number] Subjective:     Taylor Ford is a 28 y.o. female who presents for a postpartum visit. She is 4 weeks postpartum following a spontaneous vaginal delivery. I have fully reviewed the prenatal and intrapartum course. The delivery was at 39 gestational weeks. Outcome: spontaneous vaginal delivery. Anesthesia: epidural. Postpartum course has been unremarkable. Baby's course has been unremarkble. Baby is feeding by breast and bottle. Bleeding staining only. Bowel function is normal. Bladder function is normal. Patient is not sexually active. Contraception method is none. Postpartum depression screening: negative.  The following portions of the patient's history were reviewed and updated as appropriate: allergies, current medications, past family history, past medical history, past social history, past surgical history and problem list.  Review of Systems A comprehensive review of systems was negative.   Objective:    BP 112/69  Pulse 73  Temp(Src) 98.5 F (36.9 C)  Wt 178 lb 14.4 oz (81.149 kg)  Breastfeeding? Yes  General:  alert           Abdomen: soft, non-tender; bowel sounds normal; no masses,  no organomegaly   Vulva:  normal  Vagina: normal vagina, no discharge, exudate, lesion, or erythema  Cervix:  no cervical motion tenderness  Corpus: normal size, contour, position, consistency, mobility, non-tender  Adnexa:  no mass, fullness, tenderness  Rectal Exam: Not performed.        Assessment:     4 weeks postpartum exam. Pap smear not done at today's visit.   Plan:    1. Contraception: POP's- pt declines other options for contraception  2. Needs 2 hour GTT in 2 weeks 3. Follow up in: 1 year for physician visit or as needed.

## 2014-08-17 NOTE — Patient Instructions (Signed)

## 2014-09-01 ENCOUNTER — Other Ambulatory Visit: Payer: Medicaid Other

## 2014-09-01 DIAGNOSIS — O24419 Gestational diabetes mellitus in pregnancy, unspecified control: Secondary | ICD-10-CM

## 2014-09-02 LAB — GLUCOSE TOLERANCE, 2 HOURS
GLUCOSE, FASTING: 98 mg/dL (ref 70–99)
Glucose, 2 hour: 114 mg/dL (ref 70–139)

## 2014-09-05 ENCOUNTER — Telehealth: Payer: Self-pay

## 2014-09-05 NOTE — Telephone Encounter (Signed)
Patient called requesting diabetes test results from last week. Called patient and informed her of normal results. Patient verbalized understanding and gratitude. No further questions or concerns.

## 2014-10-10 ENCOUNTER — Encounter: Payer: Self-pay | Admitting: Obstetrics & Gynecology

## 2014-11-11 ENCOUNTER — Encounter: Payer: Self-pay | Admitting: Obstetrics & Gynecology

## 2016-05-13 IMAGING — US US OB FOLLOW-UP
2 series · 13 of 28 positions shown · non-contrast
Comparison: none

[Series 1: us ob follow up · 2 of 6 slices shown (1 of 2)]
[im 2/6]
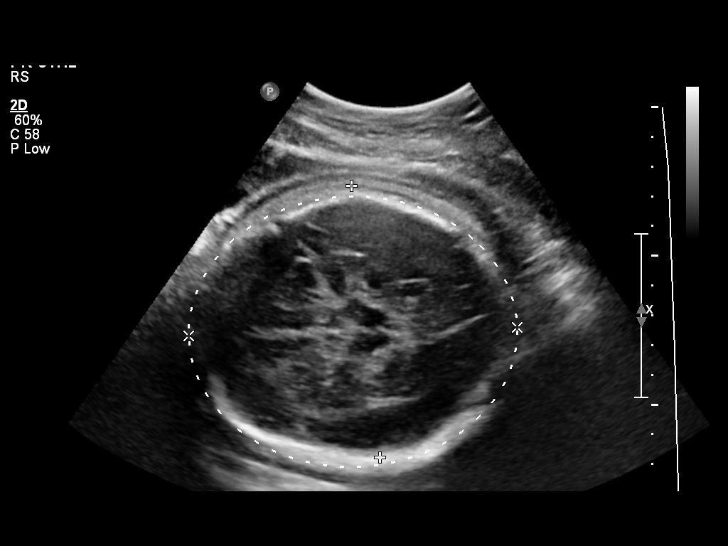
[im 4/6]
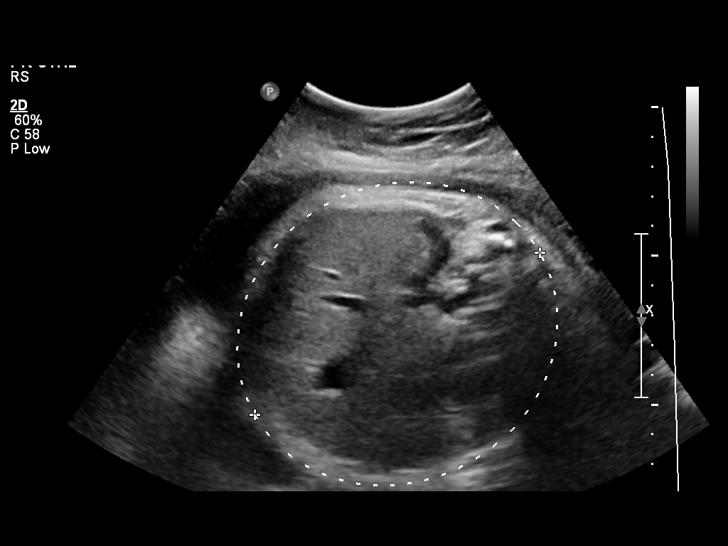

[Series 1: us ob follow up · 11 of 29 slices shown (2 of 2)]
[im 1/29]
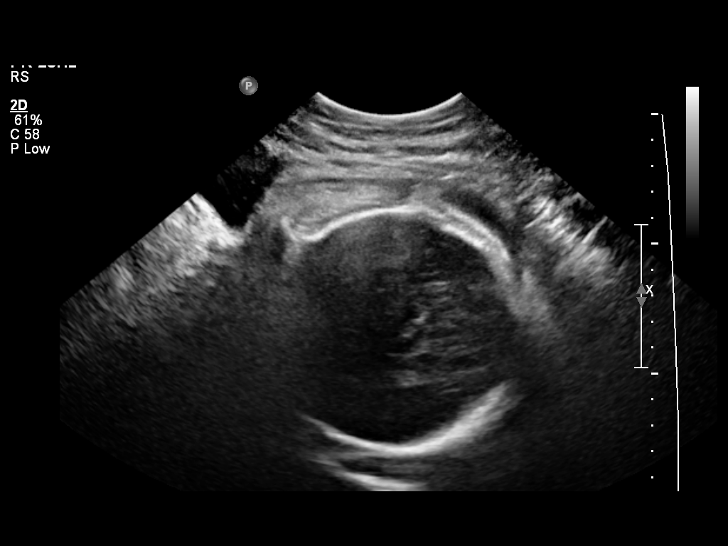
[im 3/29]
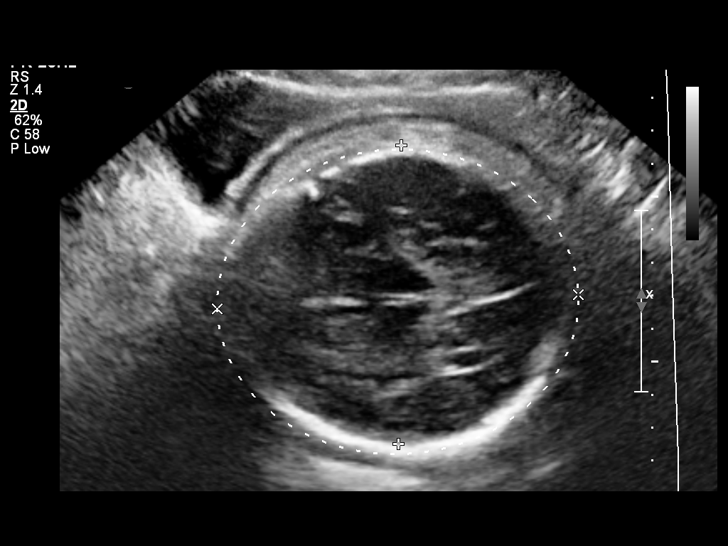
[im 6/29]
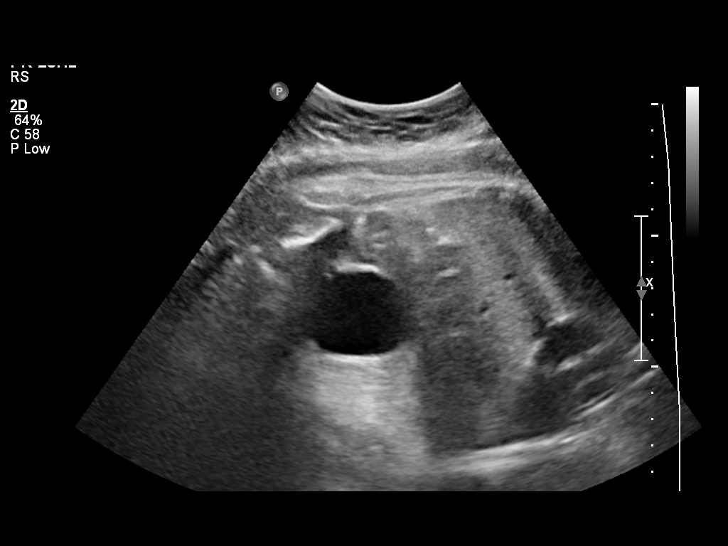
[im 8/29]
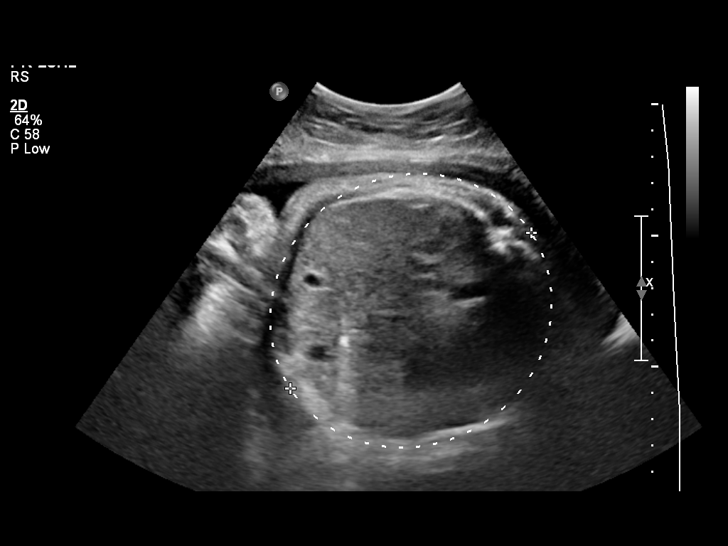
[im 12/29]
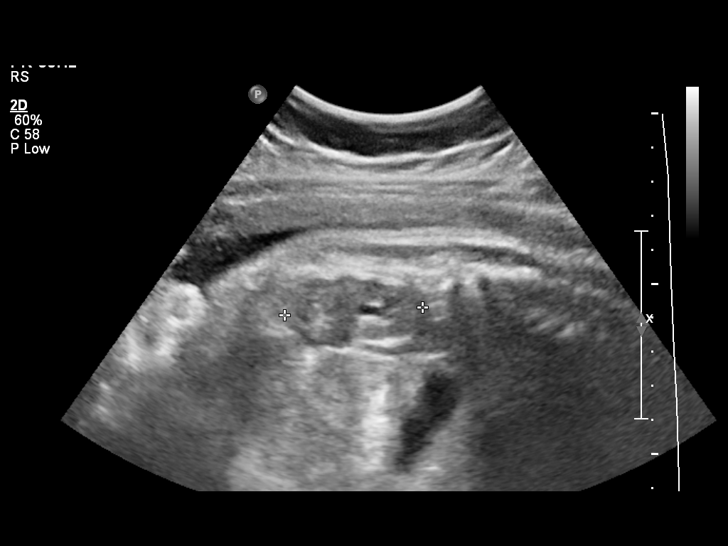
[im 15/29]
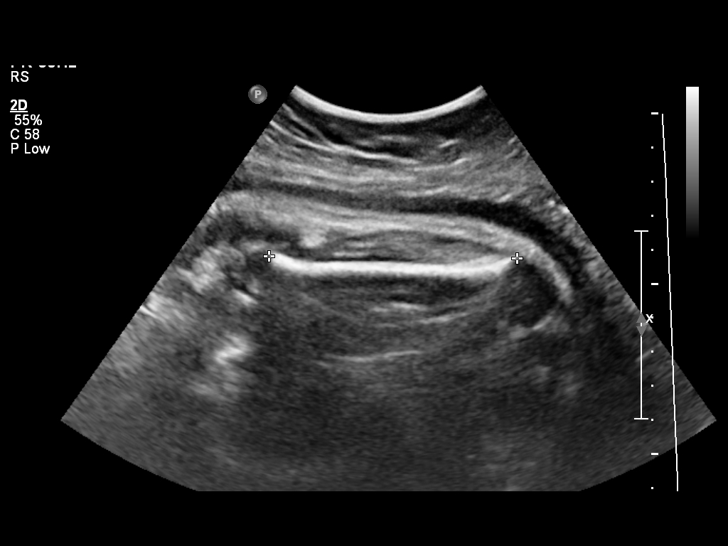
[im 17/29]
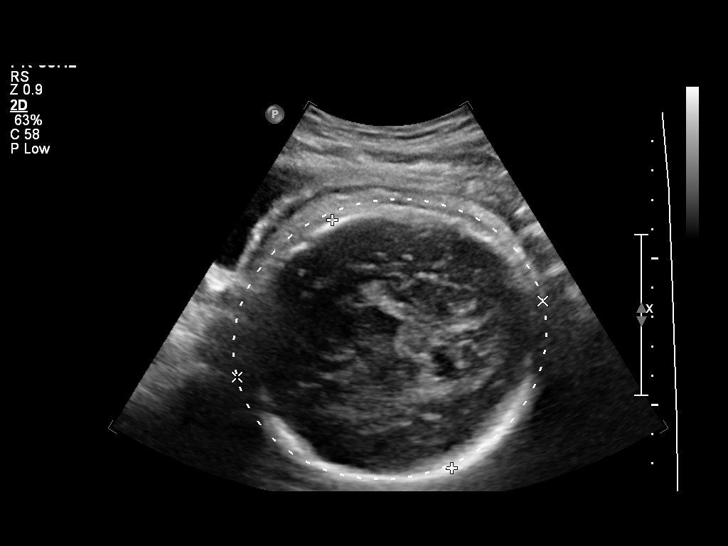
[im 20/29]
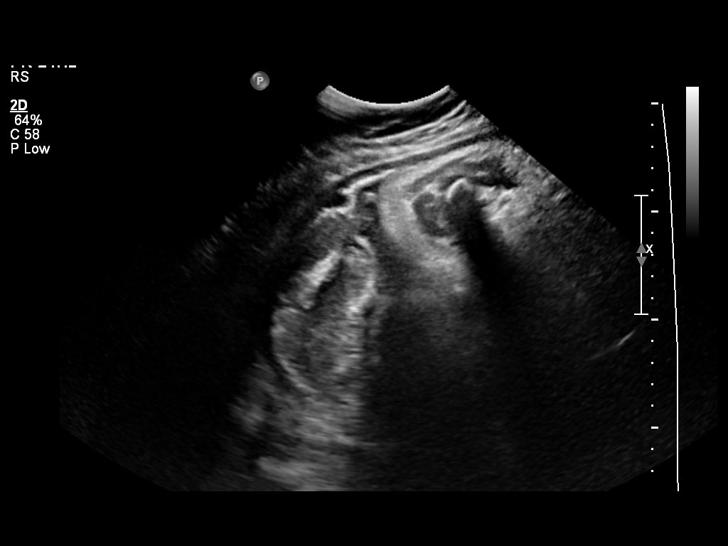
[im 22/29]
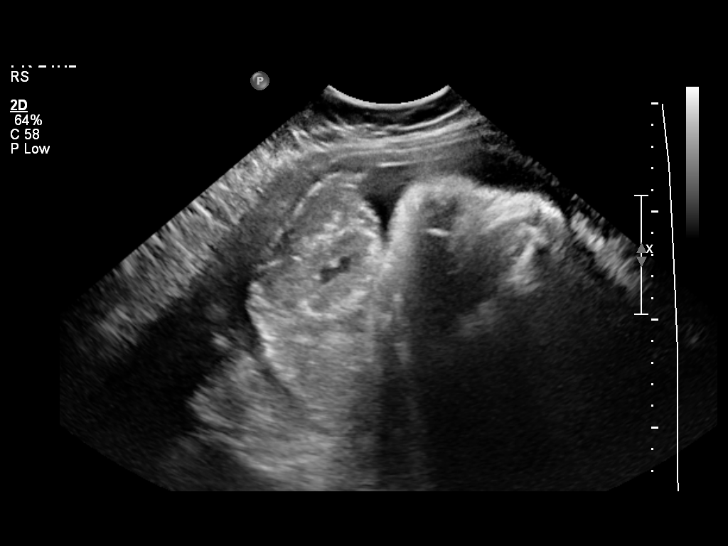
[im 25/29]
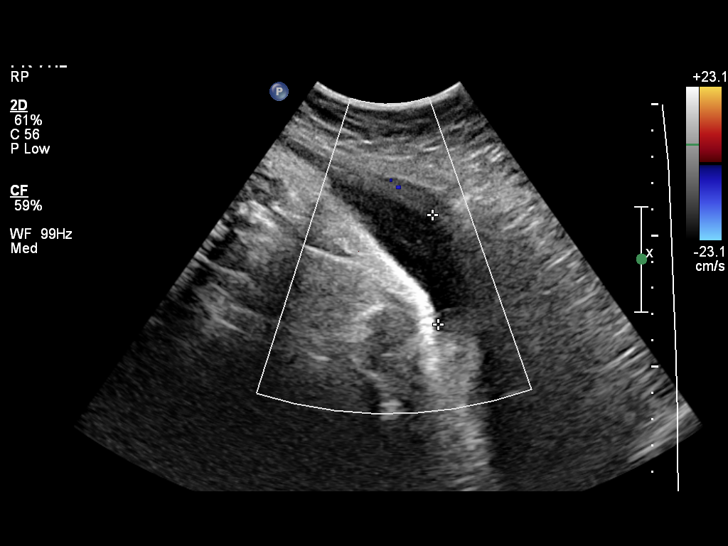
[im 27/29]
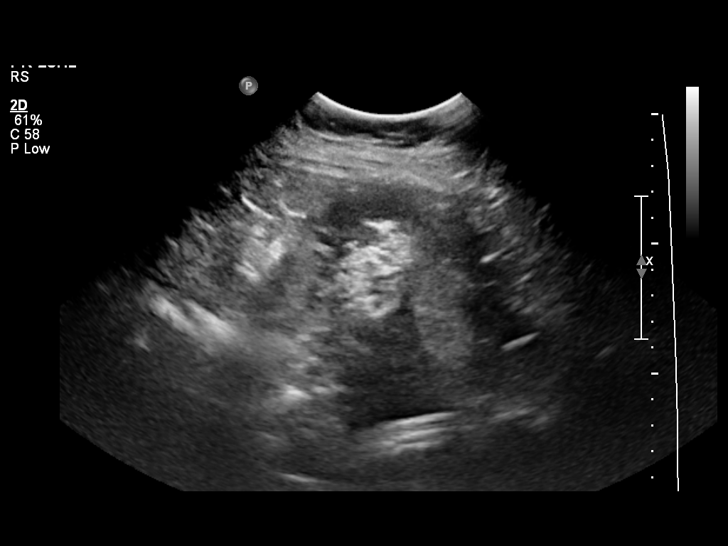

[13 of 28 positions shown; findings below may reference images not displayed]

OBSTETRICS REPORT
                      (Signed Final 07/07/2014 [DATE])

Service(s) Provided

 US OB FOLLOW UP                                       76816.1
Indications

 Diabetes - Gestational, A2 (medication controlled
 glyburide)
Fetal Evaluation

 Num Of Fetuses:    1
 Fetal Heart Rate:  156                          bpm
 Cardiac Activity:  Observed
 Presentation:      Cephalic
 Placenta:          Posterior, above cervical
                    os
 P. Cord            Previously Visualized
 Insertion:

 Amniotic Fluid
 AFI FV:      Subjectively within normal limits
 AFI Sum:     13.76   cm       53  %Tile     Larg Pckt:    5.29  cm
 RUQ:   4.16    cm   RLQ:    5.29   cm    LUQ:   1.73    cm   LLQ:    2.58   cm
Biometry

 BPD:       91  mm     G. Age:  36w 6d                CI:        79.18   70 - 86
                                                      FL/HC:      22.2   20.9 -

 HC:     323.3  mm     G. Age:  36w 4d        7  %    HC/AC:      0.98   0.92 -

 AC:     330.7  mm     G. Age:  37w 0d       36  %    FL/BPD:     79.0   71 - 87
 FL:      71.9  mm     G. Age:  36w 6d       22  %    FL/AC:      21.7   20 - 24

 Est. FW:    1318  gm    6 lb 12 oz      51  %
Gestational Age

 Clinical EDD:  38w 1d                                        EDD:   07/20/14
 U/S Today:     36w 6d                                        EDD:   07/29/14
 Best:          38w 1d     Det. By:  Clinical EDD             EDD:   07/20/14
Anatomy

 Cranium:          Appears normal         Kidneys:          Appear normal
 Stomach:          Appears normal, left   Bladder:          Appears normal
                   sided
Cervix Uterus Adnexa

 Cervix:       Not visualized (advanced GA >11wks)

 Left Ovary:    No adnexal mass visualized.
 Right Ovary:   No adnexal mass visualized.
Impression

 Single living intrauterine pregnancy at 36 weeks 6 days.
 Appropriate interval fetal growth (51%).
 Normal amniotic fluid volume.
 Normal interval fetal anatomy.
Recommendations

 Continue 2x weekly NSTs with weekly AFIs as previously
 scheduled.

                Aynur Mamatuva, Te[place]
